# Patient Record
Sex: Male | Born: 1950 | Race: Black or African American | Hispanic: No | Marital: Married | Smoking: Never smoker
Health system: Southern US, Community
[De-identification: ages and names within clinical notes are randomized; demographics above are authoritative.]

## PROBLEM LIST (undated history)

## (undated) DIAGNOSIS — F411 Generalized anxiety disorder: Secondary | ICD-10-CM

## (undated) DIAGNOSIS — I10 Essential (primary) hypertension: Secondary | ICD-10-CM

## (undated) DIAGNOSIS — C801 Malignant (primary) neoplasm, unspecified: Secondary | ICD-10-CM

## (undated) DIAGNOSIS — E291 Testicular hypofunction: Secondary | ICD-10-CM

## (undated) DIAGNOSIS — N400 Enlarged prostate without lower urinary tract symptoms: Secondary | ICD-10-CM

## (undated) DIAGNOSIS — N529 Male erectile dysfunction, unspecified: Secondary | ICD-10-CM

## (undated) DIAGNOSIS — E785 Hyperlipidemia, unspecified: Secondary | ICD-10-CM

## (undated) HISTORY — DX: Benign prostatic hyperplasia without lower urinary tract symptoms: N40.0

## (undated) HISTORY — PX: WISDOM TOOTH EXTRACTION: SHX21

## (undated) HISTORY — DX: Male erectile dysfunction, unspecified: N52.9

## (undated) HISTORY — DX: Hyperlipidemia, unspecified: E78.5

## (undated) HISTORY — DX: Essential (primary) hypertension: I10

## (undated) HISTORY — DX: Generalized anxiety disorder: F41.1

## (undated) HISTORY — PX: COLONOSCOPY: SHX174

## (undated) HISTORY — DX: Testicular hypofunction: E29.1

---

## 1994-11-05 ENCOUNTER — Encounter: Payer: Self-pay | Admitting: Internal Medicine

## 2007-09-08 ENCOUNTER — Encounter: Admission: RE | Admit: 2007-09-08 | Discharge: 2007-09-08 | Payer: Self-pay | Admitting: Family Medicine

## 2007-09-26 ENCOUNTER — Encounter: Payer: Self-pay | Admitting: Internal Medicine

## 2008-05-12 ENCOUNTER — Ambulatory Visit: Payer: Self-pay | Admitting: Internal Medicine

## 2008-05-12 DIAGNOSIS — E291 Testicular hypofunction: Secondary | ICD-10-CM | POA: Insufficient documentation

## 2008-05-12 DIAGNOSIS — E785 Hyperlipidemia, unspecified: Secondary | ICD-10-CM

## 2008-05-12 DIAGNOSIS — F411 Generalized anxiety disorder: Secondary | ICD-10-CM

## 2008-05-12 DIAGNOSIS — N400 Enlarged prostate without lower urinary tract symptoms: Secondary | ICD-10-CM | POA: Insufficient documentation

## 2008-05-12 HISTORY — DX: Testicular hypofunction: E29.1

## 2008-05-12 HISTORY — DX: Generalized anxiety disorder: F41.1

## 2008-05-12 HISTORY — DX: Hyperlipidemia, unspecified: E78.5

## 2008-05-12 HISTORY — DX: Benign prostatic hyperplasia without lower urinary tract symptoms: N40.0

## 2008-09-07 ENCOUNTER — Ambulatory Visit: Payer: Self-pay | Admitting: Internal Medicine

## 2008-09-09 ENCOUNTER — Encounter: Payer: Self-pay | Admitting: Internal Medicine

## 2008-09-09 LAB — CONVERTED CEMR LAB
Albumin: 3.9 g/dL (ref 3.5–5.2)
Alkaline Phosphatase: 63 units/L (ref 39–117)
Basophils Absolute: 0.1 10*3/uL (ref 0.0–0.1)
Basophils Relative: 3.9 % — ABNORMAL HIGH (ref 0.0–3.0)
Bilirubin Urine: NEGATIVE
CO2: 32 meq/L (ref 19–32)
Chloride: 104 meq/L (ref 96–112)
Cholesterol: 188 mg/dL (ref 0–200)
Eosinophils Absolute: 0.3 10*3/uL (ref 0.0–0.7)
Glucose, Bld: 88 mg/dL (ref 70–99)
HCT: 47.7 % (ref 39.0–52.0)
HDL: 59.8 mg/dL (ref >39.00)
Hemoglobin: 15.8 g/dL (ref 13.0–17.0)
Ketones, ur: NEGATIVE mg/dL
Leukocytes, UA: NEGATIVE
Lymphs Abs: 1.6 10*3/uL (ref 0.7–4.0)
MCHC: 33.1 g/dL (ref 30.0–36.0)
MCV: 81 fL (ref 78.0–100.0)
Neutro Abs: 1.4 10*3/uL (ref 1.4–7.7)
Nitrite: NEGATIVE
Potassium: 4.3 meq/L (ref 3.5–5.1)
RBC: 5.89 M/uL — ABNORMAL HIGH (ref 4.22–5.81)
RDW: 14.4 % (ref 11.5–14.6)
Sodium: 140 meq/L (ref 135–145)
Specific Gravity, Urine: 1.01 (ref 1.000–1.030)
TSH: 0.53 microintl units/mL (ref 0.35–5.50)
Testosterone: >1630 ng/dL — ABNORMAL HIGH (ref 350.00–890.00)
Total Protein, Urine: NEGATIVE mg/dL
Total Protein: 7 g/dL (ref 6.0–8.3)
VLDL: 13.4 mg/dL (ref 0.0–40.0)
pH: 7.5 (ref 5.0–8.0)

## 2008-12-10 ENCOUNTER — Ambulatory Visit: Payer: Self-pay | Admitting: Internal Medicine

## 2008-12-10 LAB — CONVERTED CEMR LAB
ALT: 31 units/L (ref 0–53)
AST: 24 units/L (ref 0–37)
Albumin: 4.4 g/dL (ref 3.5–5.2)
Alkaline Phosphatase: 64 units/L (ref 39–117)
Cholesterol: 184 mg/dL (ref 0–200)
HDL: 66.9 mg/dL (ref >39.00)
Testosterone: 321.17 ng/dL — ABNORMAL LOW (ref 350.00–890.00)
Total Protein: 7.8 g/dL (ref 6.0–8.3)
Triglycerides: 42 mg/dL (ref 0.0–149.0)

## 2009-02-10 ENCOUNTER — Telehealth: Payer: Self-pay | Admitting: Internal Medicine

## 2009-02-23 ENCOUNTER — Encounter: Payer: Self-pay | Admitting: Internal Medicine

## 2009-06-10 IMAGING — CR DG LUMBAR SPINE COMPLETE 4+V
5 series · 5 of 5 positions shown · non-contrast
Comparison: None

CLINICAL DATA: Low right-sided back pain radiating to the right
inguinal area.

LUMBAR SPINE - COMPLETE 4+ VIEW

[view not recorded (1 of 5)]
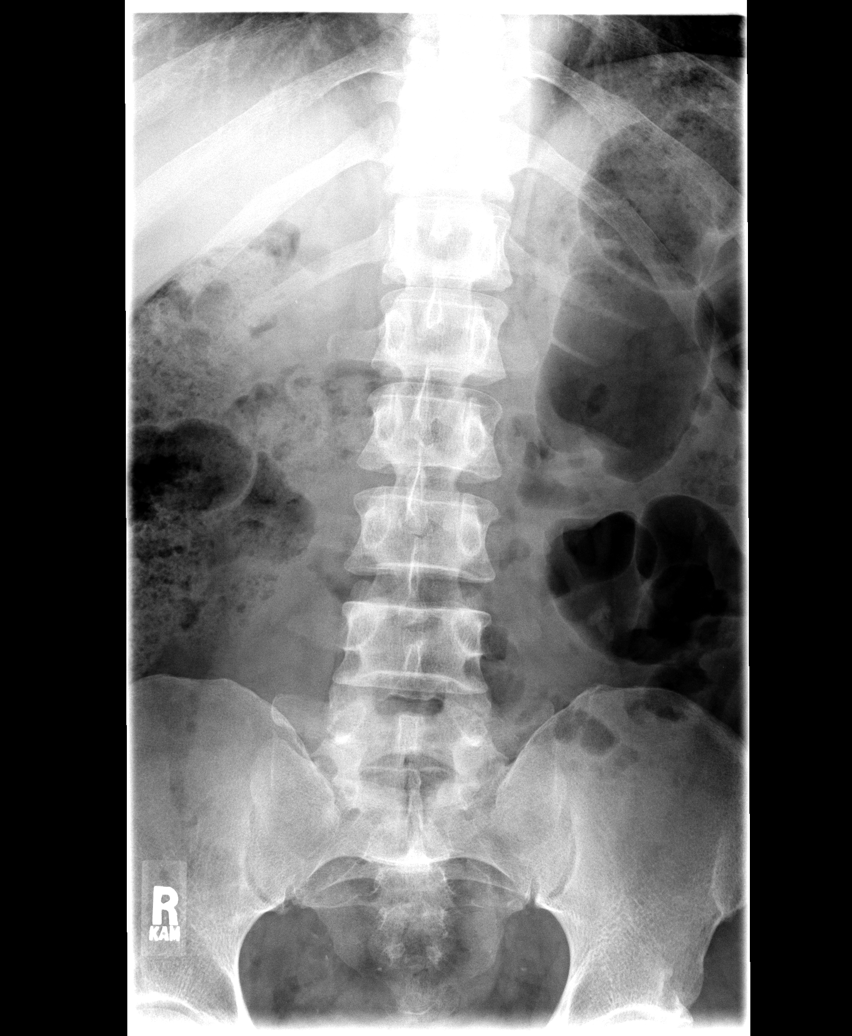

[view not recorded (2 of 5)]
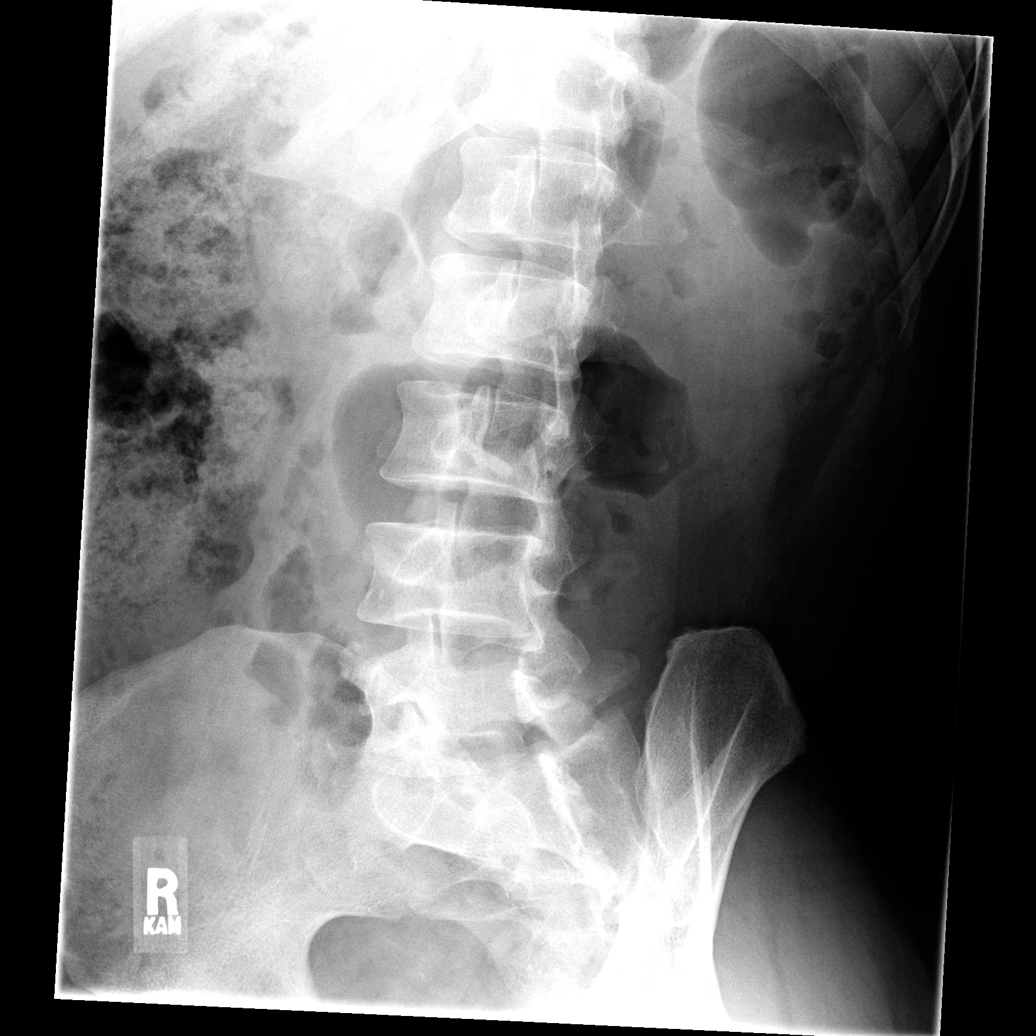

[view not recorded (3 of 5)]
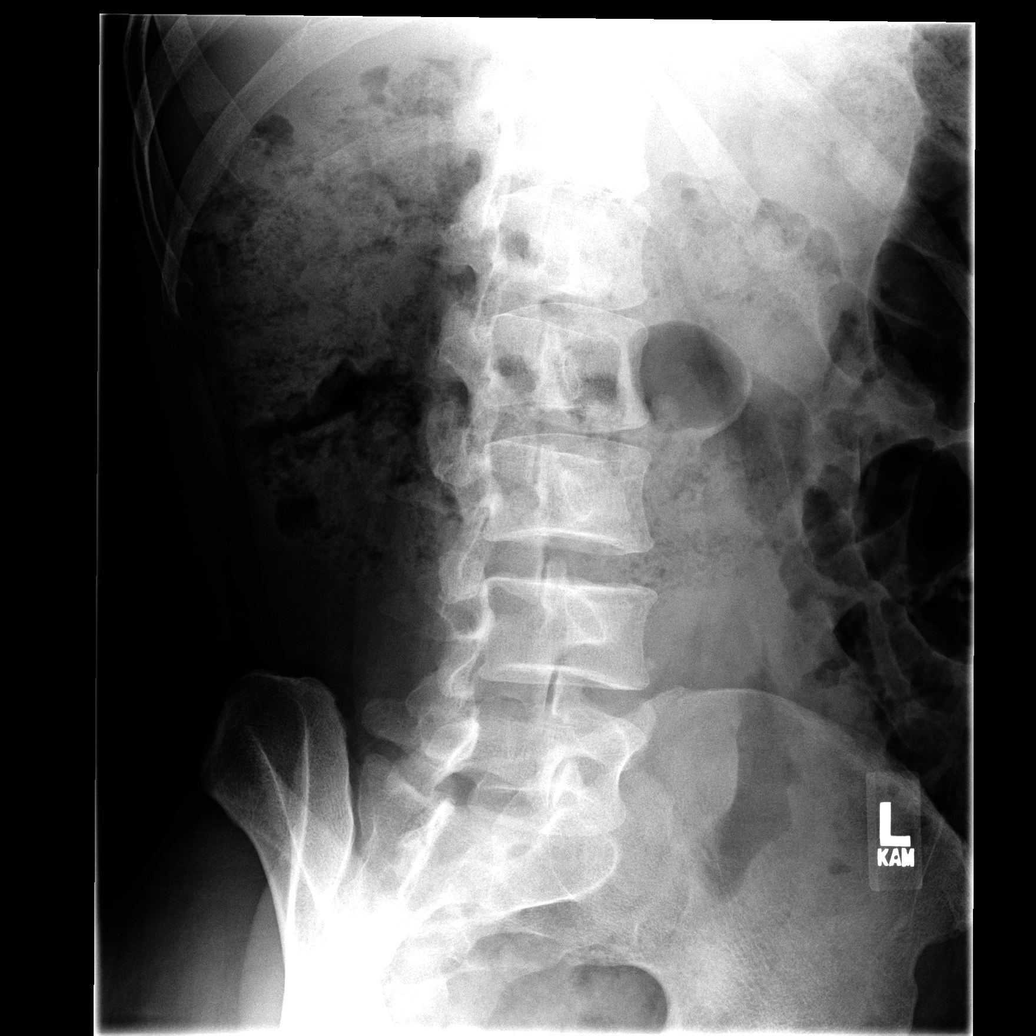

[view not recorded (4 of 5)]
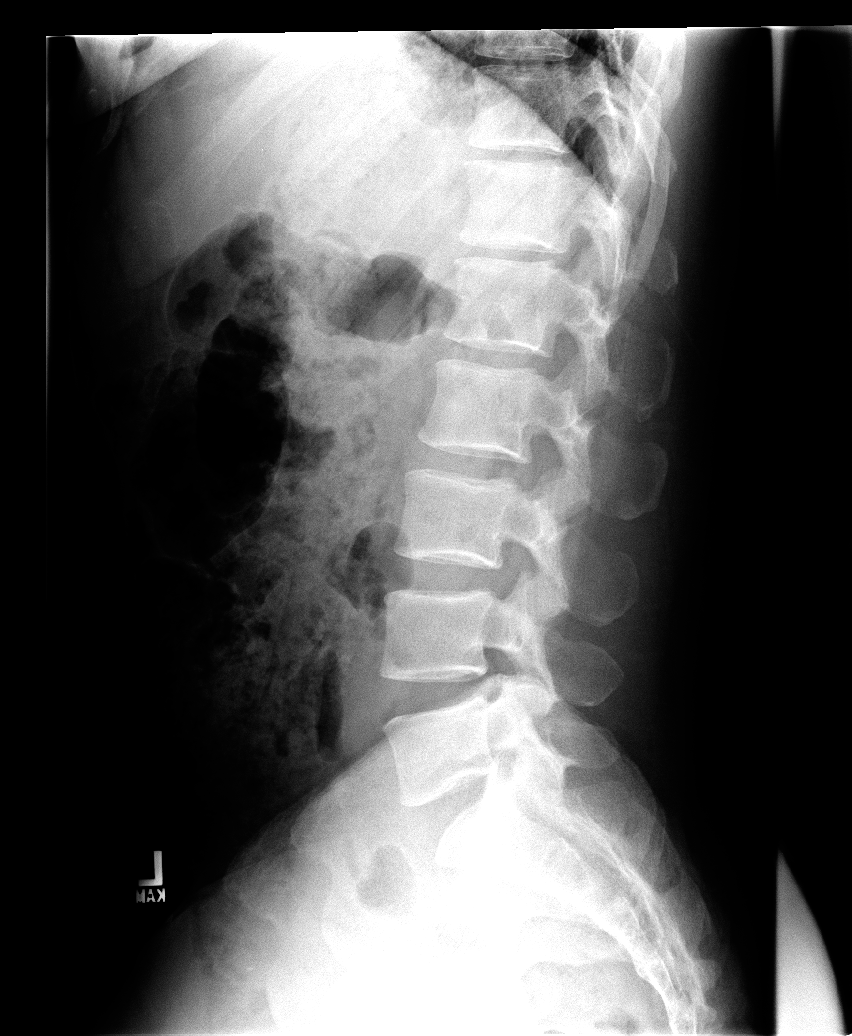

[view not recorded (5 of 5)]
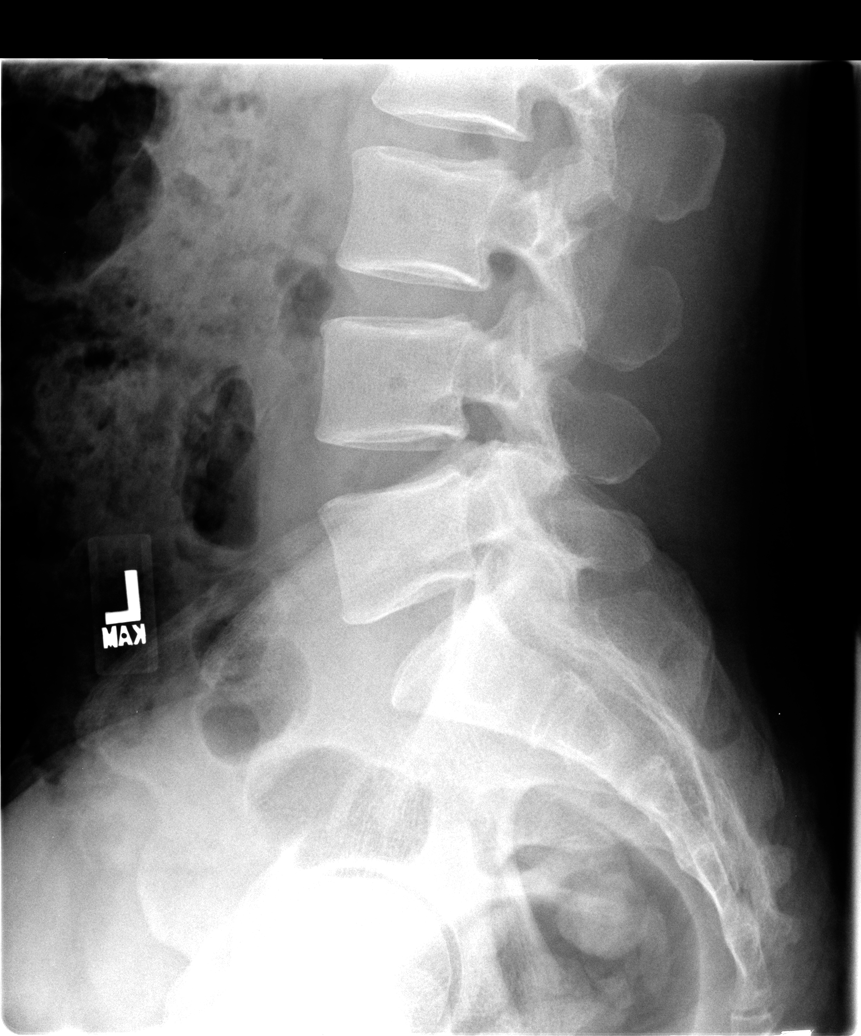

[5 of 5 positions shown; findings below may reference images not displayed]

FINDINGS: There are five lumbar type vertebral bodies in anatomic
alignment.  The disc spaces are preserved.  There is no evidence of
acute fracture, pars defect or significant facet disease.  A
moderate amount of stool is noted throughout the colon.
IMPRESSION: Normal lumbar spine radiographs.  Moderate stool throughout the
colon suggests constipation.

## 2010-01-05 ENCOUNTER — Telehealth: Payer: Self-pay | Admitting: Internal Medicine

## 2010-03-20 ENCOUNTER — Encounter: Payer: Self-pay | Admitting: Internal Medicine

## 2010-03-21 NOTE — Progress Notes (Signed)
Summary: Rx refill req  Phone Note Call from Patient Call back at Home Phone [phone]   Caller: Patient Call For: Jared Levins MD Summary of Call: Pt sched appt for CPX. Next available time was Apr 04, 2010, pt made appt for then. Pt needs refills of Lipitor to get him through till his pending appt. [facility] on [address].  Initial call taken by: Verdell Face,  January 05, 2010 3:51 PM    Prescriptions: LIPITOR 40 MG TABS (ATORVASTATIN CALCIUM) 1 by mouth once daily  #90 x 0   Entered by:   Margaret Pyle, CMA   Authorized by:   Jared Levins MD   Signed by:   Margaret Pyle, CMA on 01/05/2010   Method used:   Electronically to        [facility] [address] [phone]* (retail)       [address] [place], [place]  [address]       Ph: [phone]       Fax: [phone]   RxID:   [id number]

## 2010-03-21 NOTE — Letter (Signed)
[facility]   Imported By: Lester Shageluk 02/28/2009 08:19:34  _____________________________________________________________________  External Attachment:    Type:   Image     Comment:   External Document

## 2010-03-30 ENCOUNTER — Other Ambulatory Visit: Payer: Self-pay | Admitting: Internal Medicine

## 2010-03-30 ENCOUNTER — Other Ambulatory Visit: Payer: PRIVATE HEALTH INSURANCE

## 2010-03-30 ENCOUNTER — Encounter (INDEPENDENT_AMBULATORY_CARE_PROVIDER_SITE_OTHER): Payer: Self-pay | Admitting: *Deleted

## 2010-03-30 DIAGNOSIS — Z0000 Encounter for general adult medical examination without abnormal findings: Secondary | ICD-10-CM

## 2010-03-30 DIAGNOSIS — Z0389 Encounter for observation for other suspected diseases and conditions ruled out: Secondary | ICD-10-CM

## 2010-03-30 LAB — CBC WITH DIFFERENTIAL/PLATELET
Basophils Relative: 0.3 % (ref 0.0–3.0)
Eosinophils Absolute: 0.2 10*3/uL (ref 0.0–0.7)
HCT: 48.1 % (ref 39.0–52.0)
Hemoglobin: 15.9 g/dL (ref 13.0–17.0)
Lymphocytes Relative: 34.6 % (ref 12.0–46.0)
Lymphs Abs: 1.3 10*3/uL (ref 0.7–4.0)
MCHC: 33.1 g/dL (ref 30.0–36.0)
MCV: 80.3 fl (ref 78.0–100.0)
Neutro Abs: 1.8 10*3/uL (ref 1.4–7.7)
RBC: 5.99 Mil/uL — ABNORMAL HIGH (ref 4.22–5.81)

## 2010-03-30 LAB — URINALYSIS
Hgb urine dipstick: NEGATIVE
Ketones, ur: NEGATIVE
Urine Glucose: NEGATIVE
Urobilinogen, UA: 0.2 (ref 0.0–1.0)

## 2010-03-30 LAB — BASIC METABOLIC PANEL
CO2: 29 mEq/L (ref 19–32)
Calcium: 10.2 mg/dL (ref 8.4–10.5)
Chloride: 99 mEq/L (ref 96–112)
Glucose, Bld: 90 mg/dL (ref 70–99)
Potassium: 4.6 mEq/L (ref 3.5–5.1)
Sodium: 137 mEq/L (ref 135–145)

## 2010-03-30 LAB — HEPATIC FUNCTION PANEL
Albumin: 4.3 g/dL (ref 3.5–5.2)
Alkaline Phosphatase: 61 U/L (ref 39–117)
Total Protein: 7.1 g/dL (ref 6.0–8.3)

## 2010-03-30 LAB — LIPID PANEL: Total CHOL/HDL Ratio: 3

## 2010-03-30 LAB — TSH: TSH: 0.73 u[IU]/mL (ref 0.35–5.50)

## 2010-03-30 LAB — PSA: PSA: 0.32 ng/mL (ref 0.10–4.00)

## 2010-04-04 ENCOUNTER — Encounter (INDEPENDENT_AMBULATORY_CARE_PROVIDER_SITE_OTHER): Payer: Managed Care, Other (non HMO) | Admitting: Internal Medicine

## 2010-04-04 ENCOUNTER — Encounter: Payer: Self-pay | Admitting: Internal Medicine

## 2010-04-04 DIAGNOSIS — I10 Essential (primary) hypertension: Secondary | ICD-10-CM | POA: Insufficient documentation

## 2010-04-04 DIAGNOSIS — N529 Male erectile dysfunction, unspecified: Secondary | ICD-10-CM | POA: Insufficient documentation

## 2010-04-04 DIAGNOSIS — Z0000 Encounter for general adult medical examination without abnormal findings: Secondary | ICD-10-CM

## 2010-04-04 HISTORY — DX: Essential (primary) hypertension: I10

## 2010-04-04 HISTORY — DX: Male erectile dysfunction, unspecified: N52.9

## 2010-04-06 NOTE — Letter (Signed)
Summary: [facility]   Imported By: Sherian Rein 03/28/2010 07:13:23  _____________________________________________________________________  External Attachment:    Type:   Image     Comment:   External Document

## 2010-04-12 NOTE — Assessment & Plan Note (Signed)
Summary: CPX/[employer]/#/CD   Vital Signs:  Patient profile:   60 year old male Height:      70 inches Weight:      167.13 pounds BMI:     24.07 O2 Sat:      98 % on Room air Temp:     98.3 degrees F oral Pulse rate:   74 / minute BP sitting:   140 / 94  (left arm) Cuff size:   regular  Vitals Entered By: Zella Ball Ewing CMA (AAMA) (April 04, 2010 1:23 PM)  O2 Flow:  Room air  CC: CPX/RE   CC:  CPX/RE.  History of Present Illness: here for wellness and f/u;  Pt denies CP, worsening sob, doe, wheezing, orthopnea, pnd, worsening LE edema, palps, dizziness or syncope  Pt denies new neuro symptoms such as headache, facial or extremity weakness  Pt denies polydipsia, polyuria, overall good compliance with meds, trying to follow low chol, DM diet, wt stable, little excercise however .  Denies worsening depressive symptoms, suicidal ideation, or panic, though has ongoing anxiety.  No fever, wt loss, night sweats, loss of appetite or other constitutional symptoms  Overall good compliance with meds, and good tolerability.  No fever, wt loss, night sweats, loss of appetite or other constitutional symptoms  Pt states good ability with ADL's, low fall risk, home safety reviewed and adequate, no significant change in hearing or vision, trying to follow lower chol diet, and occasionally active only with regular excercise.  Does have worsening ED symtpoms over the past 6 mo, despite tx for hypogonad per urology.  BP at home have been often similar today , ranging in the 130 to 140's  Preventive Screening-Counseling & Management      Drug Use:  no.    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 1 By Mouth Once Daily 2)  Amitriptyline Hcl 25 Mg Tabs (Amitriptyline Hcl) .Marland Kitchen.. 1 By Mouth Once Daily 3)  Androgel 50 Mg/5gm Gel (Testosterone) .Marland Kitchen.. 1 Pk 4 Days Per Week As Directed 4)  Adult Aspirin Ec Low Strength 81 Mg Tbec (Aspirin) .Marland Kitchen.. 1 By Mouth Once Daily  Current Medications (verified): 1)  Lipitor 40 Mg Tabs (Atorvastatin Calcium) .Marland Kitchen.. 1 By Mouth Once Daily 2)  Amitriptyline Hcl 50 Mg Tabs (Amitriptyline Hcl) .Marland Kitchen.. 1 By Mouth At Bedtime As Needed 3)  Androgel 50 Mg/5gm Gel (Testosterone) .Marland Kitchen.. 1 Pk 4 Days Per Week As Directed 4)  Adult Aspirin Ec Low Strength 81 Mg Tbec (Aspirin) .Marland Kitchen.. 1 By Mouth Once Daily 5)  Losartan Potassium 50 Mg Tabs (Losartan Potassium) .Marland Kitchen.. 1po Once Daily 6)  Levitra 20 Mg Tabs (Vardenafil Hcl) .Marland Kitchen.. 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         all otherwise negative per pt -    Physical Exam  General:  alert and well-developed.   Head:  normocephalic and atraumatic.   Eyes:  vision grossly intact, pupils equal, and pupils round.   Ears:  R ear normal and L ear normal.   Nose:  no external deformity and no nasal discharge.   Mouth:  no gingival abnormalities and pharynx pink and moist.   Neck:  supple and no masses.   Lungs:  normal respiratory effort and normal breath sounds.   Heart:  normal rate and regular rhythm.   Abdomen:  soft, non-tender, and normal bowel sounds.   Msk:  normal ROM, no joint tenderness, and no joint swelling.   Extremities:  no edema, no ulcers  Neurologic:  cranial nerves II-XII intact and strength normal in all extremities.   Skin:  color normal and no rashes.   Psych:  not depressed appearing and slightly anxious.     Impression & Recommendations:  Problem # 1:  Preventive Health Care (ICD-V70.0) Overall doing well, age appropriate education and counseling updated, referral for preventive services and immunizations addressed, dietary counseling and smoking status adressed , most recent labs reviewed, ecg reviewed I have personally reviewed and have noted 1.The patient's medical and social history 2.Their use of alcohol, tobacco or illicit drugs 3.Their current medications and supplements 4. Functional ability including ADL's, fall risk, home safety risk, hearing & visual impairment  5.Diet and physical activities 6.Evidence for depression or mood disorders The patients weight, height, BMI  have been recorded in the chart I have made referrals, counseling and provided education to the patient based review of the above  Orders: EKG w/ Interpretation (93000) EKG w/ Interpretation (93000)  Problem # 2:  HYPERTENSION  (ICD-401.9)  new onset - for losartan 50 mg once daily ; f/u with BP at home and at home; to call in 3-4 wks if not improved  His updated medication list for this problem includes:    Losartan Potassium 50 Mg Tabs (Losartan potassium) .Marland Kitchen... 1po once daily  BP today: 140/94 Prior BP: 152/96 (09/07/2008)  Labs Reviewed: K+: 4.6 (03/30/2010) Creat: : 1.1 (03/30/2010)   Chol: 164 (03/30/2010)   HDL: 62.30 (03/30/2010)   LDL: 92 (03/30/2010)   TG: 47.0 (03/30/2010)  Problem # 3:  ERECTILE DYSFUNCTION, ORGANIC (ICD-607.84)  for levitra as needed   His updated medication list for this problem includes:    Levitra 20 Mg Tabs (Vardenafil hcl) .Marland Kitchen... 1 by mouth every other day as needed  Discussed proper use of medications, as well as side effects.   Problem # 4:  HYPERLIPIDEMIA (ICD-272.4)  His updated medication list for this problem includes:    Lipitor 40 Mg Tabs (Atorvastatin calcium) .Marland Kitchen... 1 by mouth once daily  Labs Reviewed: SGOT: 24 (03/30/2010)   SGPT: 28 (03/30/2010)   HDL:62.30 (03/30/2010), 66.90 (12/10/2008)  LDL:92 (03/30/2010), 109 (  12/10/2008)  Chol:164 (03/30/2010), 184 (12/10/2008)  Trig:47.0 (03/30/2010), 42.0 (12/10/2008) stable overall by hx and exam, ok to continue meds/tx as is   Complete Medication List: 1)  Lipitor 40 Mg Tabs (Atorvastatin calcium) .Marland Kitchen.. 1 by mouth once daily 2)  Amitriptyline Hcl 50 Mg Tabs (Amitriptyline hcl) .Marland Kitchen.. 1 by mouth at bedtime as needed 3)  Androgel 50 Mg/5gm Gel (Testosterone) .Marland Kitchen.. 1 pk 4 days per week as directed 4)  Adult Aspirin Ec Low Strength 81 Mg Tbec (Aspirin) .Marland Kitchen.. 1 by mouth once daily 5)  Losartan Potassium 50 Mg Tabs (Losartan potassium) .Marland Kitchen.. 1po once daily 6)  Levitra 20 Mg Tabs (Vardenafil hcl) .Marland Kitchen.. 1 by mouth every other day as needed  Patient Instructions: 1)  Please take all new medications as prescribed  - the losartan for blood pressure, and the levitra as needed  2)  Continue all previous medications as before  this visit  3)  Please keep up your specialists appts as you do 4)  Please schedule a follow-up appointment in 6 months or sooner if needed 5)  Check your Blood Pressure regularly. If it is above 140/90 on a regular basis: you should make an appointment or call , after taking the new Blood Pressure meeting for ast 3-4 wks Prescriptions: LEVITRA 20 MG TABS (VARDENAFIL HCL) 1 by mouth every other day as needed  #5 x 11   Entered and Authorized by:   Corwin Levins MD   Signed by:   Corwin Levins MD on 04/04/2010   Method used:   Print then Give to Patient   RxID:   [id number] LOSARTAN POTASSIUM 50 MG TABS (LOSARTAN POTASSIUM) 1po once daily  #90 x 3   Entered and Authorized by:   Corwin Levins MD   Signed by:   Corwin Levins MD on 04/04/2010   Method used:   Print then Give to Patient   RxID:   [id number] LIPITOR 40 MG TABS (ATORVASTATIN CALCIUM) 1 by mouth once daily  #90 x 3   Entered and Authorized by:   Corwin Levins MD   Signed by:   Corwin Levins MD on 04/04/2010   Method used:   Print then Give to Patient   RxID:   [id number] WNUUVOZD POTASSIUM 50 MG TABS (LOSARTAN POTASSIUM) 1po once daily  #30 x 11   Entered and Authorized by:   Corwin Levins MD   Signed by:   Corwin Levins MD on 04/04/2010   Method used:   Print then Give to Patient   RxID:   [id number] LIPITOR 40 MG TABS (ATORVASTATIN CALCIUM) 1 by mouth once daily  #30 x 11   Entered and Authorized by:   Corwin Levins MD   Signed by:   Corwin Levins MD on 04/04/2010   Method used:   Print then Give to Patient   RxID:   [id number]    Orders Added: 1)  EKG w/ Interpretation [93000] 2)  EKG w/ Interpretation [93000] 3)  Est. Patient 40-64 years [06301]

## 2010-06-16 DIAGNOSIS — N400 Enlarged prostate without lower urinary tract symptoms: Secondary | ICD-10-CM

## 2010-06-18 ENCOUNTER — Encounter: Payer: Self-pay | Admitting: Internal Medicine

## 2010-06-18 DIAGNOSIS — Z0000 Encounter for general adult medical examination without abnormal findings: Secondary | ICD-10-CM | POA: Insufficient documentation

## 2010-06-20 ENCOUNTER — Ambulatory Visit (INDEPENDENT_AMBULATORY_CARE_PROVIDER_SITE_OTHER): Payer: Managed Care, Other (non HMO) | Admitting: Internal Medicine

## 2010-06-20 ENCOUNTER — Encounter: Payer: Self-pay | Admitting: Internal Medicine

## 2010-06-20 VITALS — BP 132/82 | HR 85 | Temp 98.4°F | Ht 70.0 in | Wt 171.1 lb

## 2010-06-20 DIAGNOSIS — F411 Generalized anxiety disorder: Secondary | ICD-10-CM

## 2010-06-20 DIAGNOSIS — I10 Essential (primary) hypertension: Secondary | ICD-10-CM

## 2010-06-20 DIAGNOSIS — Z0000 Encounter for general adult medical examination without abnormal findings: Secondary | ICD-10-CM

## 2010-06-20 DIAGNOSIS — N529 Male erectile dysfunction, unspecified: Secondary | ICD-10-CM

## 2010-06-20 MED ORDER — SILDENAFIL CITRATE 100 MG PO TABS: 100.0000 mg | ORAL_TABLET | ORAL | Status: DC | PRN

## 2010-06-20 MED ORDER — TADALAFIL 20 MG PO TABS: 20.0000 mg | ORAL_TABLET | Freq: Every day | ORAL | Status: AC | PRN

## 2010-06-20 NOTE — Assessment & Plan Note (Signed)
Improved, Continue all other medications as before,  to f/u any worsening symptoms or concerns  BP Readings from Last 3 Encounters:  06/20/10 132/82  04/04/10 140/94  09/07/08 152/96

## 2010-06-20 NOTE — Progress Notes (Signed)
  Subjective:    Patient ID: Jared Cantrell, male    DOB: Dec 03, 1950, 60 y.o.   MRN: [id number]  HPI  Here to f/u;  Here to f/u; overall doing ok,  Pt denies chest pain, increased sob or doe, wheezing, orthopnea, PND, increased LE swelling, palpitations, dizziness or syncope.  Pt denies new neurological symptoms such as new headache, or facial or extremity weakness or numbness   Pt denies polydipsia, polyuria .  Pt states overall good compliance with meds, trying to follow lower cholesterol, diet, wt overall stable but little exercise however.  Levitra unfortunatetly simply did not seem tob e effective for him and his ED symtpoms, though has intact desire and libido, and is complaint with the testosterone replacement.  Denies worsening depressive symptoms, suicidal ideation, or panic, though has ongoing anxiety, not increased recently.   Past Medical History  Diagnosis Date  . HYPOGONADISM, MALE 05/12/2008  . HYPERLIPIDEMIA 05/12/2008  . ANXIETY 05/12/2008  . BENIGN PROSTATIC HYPERTROPHY 05/12/2008  . HYPERTENSION 04/04/2010  . 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  reports that he has never smoked. He does not have any smokeless tobacco history on file. He reports that he drinks alcohol. He reports that he does not use illicit drugs. family history includes Arthritis in his mother; Cancer in his brother; Diabetes in his mother; Hypertension in his brother, mother, and sister; and Stroke in his sister. No Known Allergies Current Outpatient Prescriptions on File Prior to Visit  Medication Sig Dispense Refill  . amitriptyline (ELAVIL) 50 MG tablet Take 50 mg by mouth at bedtime.        Marland Kitchen aspirin 81 MG EC tablet Take 81 mg by mouth daily.        Marland Kitchen atorvastatin (LIPITOR) 40 MG tablet Take 40 mg by mouth daily.        Marland Kitchen losartan (COZAAR) 50 MG tablet Take 50 mg by mouth daily.        Marland Kitchen testosterone (ANDROGEL) 50 MG/5GM GEL 1 pack 4 days per week as directed        . vardenafil (LEVITRA) 20 MG tablet 1 by mouth every other day as needed        Review of Systems All otherwise neg per pt     Objective:   Physical Exam BP 132/82  Pulse 85  Temp(Src) 98.4 F (36.9 C) (Oral)  Ht 5\' 10"  (1.778 m)  Wt 171 lb 2 oz (77.622 kg)  BMI 24.55 kg/m2  SpO2 97% Physical Exam  VS noted Constitutional: Pt appears well-developed and well-nourished.  HENT: Head: Normocephalic.  Right Ear: External ear normal.  Left Ear: External ear normal.  Eyes: Conjunctivae and EOM are normal. Pupils are equal, round, and reactive to light.  Neck: Normal range of motion. Neck supple.  Cardiovascular: Normal rate and regular rhythm.   Pulmonary/Chest: Effort normal and breath sounds normal.  Abd:  Soft, NT, non-distended, + BS Neurological: Pt is alert. No cranial nerve deficit.  Skin: Skin is warm. No erythema.  Psychiatric: Pt behavior is normal. Thought content normal.         Assessment & Plan:

## 2010-06-20 NOTE — Patient Instructions (Signed)
The cialis prescription is at the pharmacy for the free coupon to use You are given the hardcopy of the viagra as you need Continue all other medications as before Please return in 14mo  for your yearly visit, or sooner if needed, with Lab testing done 3-5 days before

## 2010-06-20 NOTE — Assessment & Plan Note (Signed)
No response with the levitra, today gave coupon and rx for 3 free cialis, as well as rx for viagra to try; pt to cont testosterone replacement per urology,  to f/u any worsening symptoms or concerns

## 2010-06-20 NOTE — Assessment & Plan Note (Signed)
stable overall by hx and exam, most recent lab reviewed with pt, and pt to continue medical treatment as before  Lab Results  Component Value Date   WBC 3.7* 03/30/2010   HGB 15.9 03/30/2010   HCT 48.1 03/30/2010   PLT 192.0 03/30/2010   CHOL 164 03/30/2010   TRIG 47.0 03/30/2010   HDL 62.30 03/30/2010   ALT 28 03/30/2010   AST 24 03/30/2010   NA 137 03/30/2010   K 4.6 03/30/2010   CL 99 03/30/2010   CREATININE 1.1 03/30/2010   BUN 11 03/30/2010   CO2 29 03/30/2010   TSH 0.73 03/30/2010   PSA 0.32 03/30/2010

## 2010-10-03 ENCOUNTER — Ambulatory Visit: Payer: Managed Care, Other (non HMO) | Admitting: Internal Medicine

## 2011-03-23 ENCOUNTER — Ambulatory Visit (INDEPENDENT_AMBULATORY_CARE_PROVIDER_SITE_OTHER): Payer: Managed Care, Other (non HMO) | Admitting: Internal Medicine

## 2011-03-23 ENCOUNTER — Other Ambulatory Visit (INDEPENDENT_AMBULATORY_CARE_PROVIDER_SITE_OTHER): Payer: Managed Care, Other (non HMO)

## 2011-03-23 ENCOUNTER — Encounter: Payer: Self-pay | Admitting: Internal Medicine

## 2011-03-23 VITALS — BP 122/82 | HR 71 | Temp 97.0°F | Ht 70.0 in | Wt 171.0 lb

## 2011-03-23 DIAGNOSIS — Z0000 Encounter for general adult medical examination without abnormal findings: Secondary | ICD-10-CM

## 2011-03-23 DIAGNOSIS — R42 Dizziness and giddiness: Secondary | ICD-10-CM

## 2011-03-23 DIAGNOSIS — E291 Testicular hypofunction: Secondary | ICD-10-CM

## 2011-03-23 DIAGNOSIS — N529 Male erectile dysfunction, unspecified: Secondary | ICD-10-CM

## 2011-03-23 LAB — CBC WITH DIFFERENTIAL/PLATELET
Basophils Absolute: 0 10*3/uL (ref 0.0–0.1)
HCT: 50.4 % (ref 39.0–52.0)
Lymphs Abs: 1.4 10*3/uL (ref 0.7–4.0)
MCHC: 32.1 g/dL (ref 30.0–36.0)
MCV: 81.8 fl (ref 78.0–100.0)
Monocytes Absolute: 0.3 10*3/uL (ref 0.1–1.0)
Platelets: 203 10*3/uL (ref 150.0–400.0)
RDW: 15.5 % — ABNORMAL HIGH (ref 11.5–14.6)

## 2011-03-23 LAB — URINALYSIS, ROUTINE W REFLEX MICROSCOPIC
Bilirubin Urine: NEGATIVE
Ketones, ur: 15
Leukocytes, UA: NEGATIVE
Urine Glucose: NEGATIVE
Urobilinogen, UA: 0.2 (ref 0.0–1.0)

## 2011-03-23 LAB — HEPATIC FUNCTION PANEL
ALT: 32 U/L (ref 0–53)
Bilirubin, Direct: 0.1 mg/dL (ref 0.0–0.3)
Total Bilirubin: 0.9 mg/dL (ref 0.3–1.2)

## 2011-03-23 LAB — BASIC METABOLIC PANEL
BUN: 13 mg/dL (ref 6–23)
CO2: 31 mEq/L (ref 19–32)
Chloride: 101 mEq/L (ref 96–112)
Glucose, Bld: 90 mg/dL (ref 70–99)
Potassium: 4.7 mEq/L (ref 3.5–5.1)

## 2011-03-23 LAB — LIPID PANEL
LDL Cholesterol: 111 mg/dL — ABNORMAL HIGH (ref 0–99)
VLDL: 10.8 mg/dL (ref 0.0–40.0)

## 2011-03-23 LAB — TSH: TSH: 0.69 u[IU]/mL (ref 0.35–5.50)

## 2011-03-23 MED ORDER — LOSARTAN POTASSIUM 50 MG PO TABS: 50.0000 mg | ORAL_TABLET | Freq: Every day | ORAL | Status: DC

## 2011-03-23 MED ORDER — TESTOSTERONE 20.25 MG/1.25GM (1.62%) TD GEL: 1.0000 "application " | Freq: Every day | TRANSDERMAL | Status: DC

## 2011-03-23 MED ORDER — SILDENAFIL CITRATE 100 MG PO TABS: 100.0000 mg | ORAL_TABLET | ORAL | Status: DC | PRN

## 2011-03-23 MED ORDER — AMITRIPTYLINE HCL 50 MG PO TABS: 50.0000 mg | ORAL_TABLET | Freq: Every day | ORAL | Status: DC

## 2011-03-23 MED ORDER — ATORVASTATIN CALCIUM 40 MG PO TABS: 40.0000 mg | ORAL_TABLET | Freq: Every day | ORAL | Status: DC

## 2011-03-23 MED ORDER — TESTOSTERONE 20.25 MG/ACT (1.62%) TD GEL: 1.0000 "application " | Freq: Every day | TRANSDERMAL | Status: DC

## 2011-03-23 NOTE — Assessment & Plan Note (Signed)
With head position change only, but reproducible with full neck flexion, for carotid u/s

## 2011-03-23 NOTE — Patient Instructions (Signed)
Continue all other medications as before; you are given the hardcopy refills today Please go to LAB in the Basement for the blood and/or urine tests to be done today Please call the phone number [phone] (the PhoneTree System) for results of testing in 2-3 days;  When calling, simply dial the number, and when prompted enter the MRN number above (the Medical Record Number) and the # key, then the message should start. You will be contacted regarding the referral for: carotid ultrasound Please return in 1 year for your yearly visit, or sooner if needed, with Lab testing done 3-5 days before

## 2011-03-24 ENCOUNTER — Encounter: Payer: Self-pay | Admitting: Internal Medicine

## 2011-03-24 NOTE — Progress Notes (Signed)
Subjective:    Patient ID: Jared Cantrell, male    DOB: 07-Jun-1950, 61 y.o.   MRN: [id number]  HPI  Here for wellness and f/u;  Overall doing ok;  Pt denies CP, worsening SOB, DOE, wheezing, orthopnea, PND, worsening LE edema, palpitations, or syncope.  Pt denies neurological change such as new Headache, facial or extremity weakness.  Pt denies polydipsia, polyuria, or low sugar symptoms. Pt states overall good compliance with treatment and medications, good tolerability, and trying to follow lower cholesterol diet.  Pt denies worsening depressive symptoms, suicidal ideation or panic. No fever, wt loss, night sweats, loss of appetite, or other constitutional symptoms.  Pt states good ability with ADL's, low fall risk, home safety reviewed and adequate, no significant changes in hearing or vision, and occasionally active with exercise.  Has been out of testosterone pump, needs refill.  Also c/o unusual problem for 1-2 yrs worsening freq but reproducible symptoms of dizzy/lightheaded ((not vertigo) when head/chin is lowered to chest, no syncope. Past Medical History  Diagnosis Date  . HYPOGONADISM, MALE 05/12/2008  . HYPERLIPIDEMIA 05/12/2008  . ANXIETY 05/12/2008  . BENIGN PROSTATIC HYPERTROPHY 05/12/2008  . HYPERTENSION 04/04/2010  . 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  reports that he has never smoked. He does not have any smokeless tobacco history on file. He reports that he drinks alcohol. He reports that he does not use illicit drugs. family history includes Arthritis in his mother; Cancer in his brother; Diabetes in his mother; Hypertension in his brother, mother, and sister; and Stroke in his sister. No Known Allergies Current Outpatient Prescriptions on File Prior to Visit  Medication Sig Dispense Refill  . aspirin 81 MG EC tablet Take 81 mg by mouth daily.         Review of Systems Review of Systems  Constitutional: Negative for diaphoresis,  activity change, appetite change and unexpected weight change.  HENT: Negative for hearing loss, ear pain, facial swelling, mouth sores and neck stiffness.   Eyes: Negative for pain, redness and visual disturbance.  Respiratory: Negative for shortness of breath and wheezing.   Cardiovascular: Negative for chest pain and palpitations.  Gastrointestinal: Negative for diarrhea, blood in stool, abdominal distention and rectal pain.  Genitourinary: Negative for hematuria, flank pain and decreased urine volume.  Musculoskeletal: Negative for myalgias and joint swelling.  Skin: Negative for color change and wound.  Neurological: Negative for syncope and numbness.  Hematological: Negative for adenopathy.  Psychiatric/Behavioral: Negative for hallucinations, self-injury, decreased concentration and agitation.      Objective:   Physical Exam BP 122/82  Pulse 71  Temp(Src) 97 F (36.1 C) (Oral)  Ht 5\' 10"  (1.778 m)  Wt 171 lb (77.565 kg)  BMI 24.54 kg/m2  SpO2 95% Physical Exam  VS noted Constitutional: Pt is oriented to person, place, and time. Appears well-developed and well-nourished.  HENT:  Head: Normocephalic and atraumatic.  Right Ear: External ear normal.  Left Ear: External ear normal.  Nose: Nose normal.  Mouth/Throat: Oropharynx is clear and moist.  Eyes: Conjunctivae and EOM are normal. Pupils are equal, round, and reactive to light.  Neck: Normal range of motion. Neck supple. No JVD present. No tracheal deviation present.  Cardiovascular: Normal rate, regular rhythm, normal heart sounds and intact distal pulses.   Pulmonary/Chest: Effort normal and breath sounds normal.  Abdominal: Soft. Bowel sounds are normal. There is no tenderness.  Musculoskeletal: Normal range of motion. Exhibits no edema.  Lymphadenopathy:  Has no cervical adenopathy.  Neurological: Pt is alert and oriented to person, place, and time. Pt has normal reflexes. No cranial nerve deficit.  Skin: Skin is  warm and dry. No rash noted.  Psychiatric:  Has  normal mood and affect. Behavior is normal.     Assessment & Plan:

## 2011-03-24 NOTE — Assessment & Plan Note (Signed)
For med refill today 

## 2011-03-24 NOTE — Assessment & Plan Note (Signed)

## 2011-03-24 NOTE — Assessment & Plan Note (Signed)
For viagra refill as well

## 2011-03-26 ENCOUNTER — Encounter (INDEPENDENT_AMBULATORY_CARE_PROVIDER_SITE_OTHER): Payer: Managed Care, Other (non HMO) | Admitting: Cardiology

## 2011-03-26 ENCOUNTER — Other Ambulatory Visit: Payer: Self-pay | Admitting: Cardiology

## 2011-03-26 DIAGNOSIS — R42 Dizziness and giddiness: Secondary | ICD-10-CM

## 2011-03-26 LAB — TESTOSTERONE, FREE, TOTAL, SHBG
Testosterone-% Free: 1.7 % (ref 1.6–2.9)
Testosterone: 197.35 ng/dL — ABNORMAL LOW (ref 250–890)

## 2011-04-15 ENCOUNTER — Other Ambulatory Visit: Payer: Self-pay | Admitting: Internal Medicine

## 2011-04-18 ENCOUNTER — Other Ambulatory Visit: Payer: Self-pay

## 2011-04-18 MED ORDER — LOSARTAN POTASSIUM 50 MG PO TABS: 50.0000 mg | ORAL_TABLET | Freq: Every day | ORAL | Status: DC

## 2011-07-09 ENCOUNTER — Telehealth: Payer: Self-pay

## 2011-07-09 NOTE — Telephone Encounter (Signed)
Called [facility] for PA on Viagra and Testosterone Gel .  Both medications have been approved. Viagra from 06/18/11 through 07/08/2012 GN#[id number] and Testosterone gel approved 06/09/11 through 07/08/2012 Ref# [id number].  Called the patient informed that both medications have been approved.  [facility] will send approval letter on both medications within 30 minutes. Copied all information put in PA folder and sent orginals to scan into chart.

## 2012-03-25 ENCOUNTER — Other Ambulatory Visit (INDEPENDENT_AMBULATORY_CARE_PROVIDER_SITE_OTHER): Payer: Managed Care, Other (non HMO)

## 2012-03-25 ENCOUNTER — Ambulatory Visit (INDEPENDENT_AMBULATORY_CARE_PROVIDER_SITE_OTHER)
Admission: RE | Admit: 2012-03-25 | Discharge: 2012-03-25 | Disposition: A | Discharge status: Home / Self Care | Payer: BC Managed Care – PPO | Source: Ambulatory Visit | Attending: Internal Medicine | Admitting: Internal Medicine

## 2012-03-25 ENCOUNTER — Encounter: Payer: Self-pay | Admitting: Internal Medicine

## 2012-03-25 ENCOUNTER — Ambulatory Visit (INDEPENDENT_AMBULATORY_CARE_PROVIDER_SITE_OTHER): Payer: BC Managed Care – PPO | Admitting: Internal Medicine

## 2012-03-25 VITALS — BP 134/92 | HR 95 | Temp 98.0°F | Ht 70.0 in | Wt 173.5 lb

## 2012-03-25 DIAGNOSIS — I10 Essential (primary) hypertension: Secondary | ICD-10-CM

## 2012-03-25 DIAGNOSIS — E291 Testicular hypofunction: Secondary | ICD-10-CM

## 2012-03-25 DIAGNOSIS — Z0000 Encounter for general adult medical examination without abnormal findings: Secondary | ICD-10-CM

## 2012-03-25 DIAGNOSIS — E785 Hyperlipidemia, unspecified: Secondary | ICD-10-CM

## 2012-03-25 LAB — HEPATIC FUNCTION PANEL
AST: 30 U/L (ref 0–37)
Albumin: 4.2 g/dL (ref 3.5–5.2)
Alkaline Phosphatase: 67 U/L (ref 39–117)
Bilirubin, Direct: 0.2 mg/dL (ref 0.0–0.3)
Total Bilirubin: 0.9 mg/dL (ref 0.3–1.2)

## 2012-03-25 LAB — CBC WITH DIFFERENTIAL/PLATELET
Basophils Absolute: 0 10*3/uL (ref 0.0–0.1)
Eosinophils Absolute: 0.3 10*3/uL (ref 0.0–0.7)
Hemoglobin: 15.6 g/dL (ref 13.0–17.0)
Lymphocytes Relative: 32.1 % (ref 12.0–46.0)
MCHC: 32.4 g/dL (ref 30.0–36.0)
Monocytes Relative: 11.1 % (ref 3.0–12.0)
Neutro Abs: 2 10*3/uL (ref 1.4–7.7)
Neutrophils Relative %: 49.9 % (ref 43.0–77.0)
RDW: 15.7 % — ABNORMAL HIGH (ref 11.5–14.6)

## 2012-03-25 LAB — URINALYSIS, ROUTINE W REFLEX MICROSCOPIC
Nitrite: NEGATIVE
Specific Gravity, Urine: 1.01 (ref 1.000–1.030)
Total Protein, Urine: NEGATIVE
Urine Glucose: NEGATIVE
Urobilinogen, UA: 0.2 (ref 0.0–1.0)

## 2012-03-25 LAB — LIPID PANEL
LDL Cholesterol: 108 mg/dL — ABNORMAL HIGH (ref 0–99)
Total CHOL/HDL Ratio: 3

## 2012-03-25 LAB — BASIC METABOLIC PANEL
BUN: 11 mg/dL (ref 6–23)
CO2: 32 mEq/L (ref 19–32)
Calcium: 10.6 mg/dL — ABNORMAL HIGH (ref 8.4–10.5)
Chloride: 103 mEq/L (ref 96–112)
Creatinine, Ser: 1 mg/dL (ref 0.4–1.5)
Glucose, Bld: 98 mg/dL (ref 70–99)

## 2012-03-25 LAB — TSH: TSH: 0.88 u[IU]/mL (ref 0.35–5.50)

## 2012-03-25 MED ORDER — AMITRIPTYLINE HCL 50 MG PO TABS: 50.0000 mg | ORAL_TABLET | Freq: Every day | ORAL | Status: AC

## 2012-03-25 MED ORDER — ATORVASTATIN CALCIUM 40 MG PO TABS: 40.0000 mg | ORAL_TABLET | Freq: Every day | ORAL | Status: DC

## 2012-03-25 MED ORDER — LOSARTAN POTASSIUM 100 MG PO TABS: 100.0000 mg | ORAL_TABLET | Freq: Every day | ORAL | Status: DC

## 2012-03-25 NOTE — Patient Instructions (Addendum)
Ok to increase the losartan to 100 mg per day Please continue all other medications as before, and refills have been done hardcopy to you today Please have the pharmacy call with any other refills you may need. Please continue your efforts at being more active, low cholesterol diet, and weight control. You are otherwise up to date with prevention measures today. Please go to the LAB in the Basement (turn left off the elevator) for the tests to be done today Please go to the XRAY Department in the Basement (go straight as you get off the elevator) for the x-ray testing You will be contacted by phone if any changes need to be made immediately.  Otherwise, you will receive a letter about your results with an explanation, but please check with MyChart first. Thank you for enrolling in MyChart. Please follow the instructions below to securely access your online medical record. MyChart allows you to send messages to your doctor, view your test results, renew your prescriptions, schedule appointments, and more. To Log into My Chart online, please go by [employer] or [employer] to [email or web address], or download the MyChart App from the [employer] .  Your Username is: [email or web address]  (pass [email or web address]) Please send a practice Message on Mychart later today. Please return in 6 months, or sooner if needed

## 2012-03-25 NOTE — Assessment & Plan Note (Signed)

## 2012-03-25 NOTE — Assessment & Plan Note (Addendum)
Mild, with trend upward over last several yrs; for PTH/ion calcium, CXR, vit d

## 2012-03-25 NOTE — Assessment & Plan Note (Signed)
Mild uncontrolled, to incr the losartan to 100 mg

## 2012-03-26 LAB — TESTOSTERONE, FREE, TOTAL, SHBG: Testosterone-% Free: 2.2 % (ref 1.6–2.9)

## 2012-03-30 ENCOUNTER — Encounter: Payer: Self-pay | Admitting: Internal Medicine

## 2012-03-30 NOTE — Progress Notes (Signed)
Subjective:    Patient ID: Jared Cantrell, male    DOB: 10-19-1950, 62 y.o.   MRN: [id number]  HPI  Here for wellness and f/u;  Overall doing ok;  Pt denies CP, worsening SOB, DOE, wheezing, orthopnea, PND, worsening LE edema, palpitations, dizziness or syncope.  Pt denies neurological change such as new headache, facial or extremity weakness.  Pt denies polydipsia, polyuria, or low sugar symptoms. Pt states overall good compliance with treatment and medications, good tolerability, and has been trying to follow lower cholesterol diet.  Pt denies worsening depressive symptoms, suicidal ideation or panic. No fever, night sweats, wt loss, loss of appetite, or other constitutional symptoms.  Pt states good ability with ADL's, has low fall risk, home safety reviewed and adequate, no other significant changes in hearing or vision, and only occasionally active with exercise.  No acute complaints Past Medical History  Diagnosis Date  . HYPOGONADISM, MALE 05/12/2008  . HYPERLIPIDEMIA 05/12/2008  . ANXIETY 05/12/2008  . BENIGN PROSTATIC HYPERTROPHY 05/12/2008  . HYPERTENSION 04/04/2010  . 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  reports that he has never smoked. He does not have any smokeless tobacco history on file. He reports that  drinks alcohol. He reports that he does not use illicit drugs. family history includes Arthritis in his mother; Cancer in his brother; Diabetes in his mother; Hypertension in his brother, mother, and sister; and Stroke in his sister. No Known Allergies Current Outpatient Prescriptions on File Prior to Visit  Medication Sig Dispense Refill  . aspirin 81 MG EC tablet Take 81 mg by mouth daily.        . Testosterone 20.25 MG/ACT (1.62%) GEL Place 1 application onto the skin daily.  225 g  1  . sildenafil (VIAGRA) 100 MG tablet Take 1 tablet (100 mg total) by mouth as needed for erectile dysfunction.  10 tablet  5   No current  facility-administered medications on file prior to visit.   Review of Systems Constitutional: Negative for diaphoresis, activity change, appetite change or unexpected weight change.  HENT: Negative for hearing loss, ear pain, facial swelling, mouth sores and neck stiffness.   Eyes: Negative for pain, redness and visual disturbance.  Respiratory: Negative for shortness of breath and wheezing.   Cardiovascular: Negative for chest pain and palpitations.  Gastrointestinal: Negative for diarrhea, blood in stool, abdominal distention or other pain Genitourinary: Negative for hematuria, flank pain or change in urine volume.  Musculoskeletal: Negative for myalgias and joint swelling.  Skin: Negative for color change and wound.  Neurological: Negative for syncope and numbness. other than noted Hematological: Negative for adenopathy.  Psychiatric/Behavioral: Negative for hallucinations, self-injury, decreased concentration and agitation.      Objective:   Physical Exam BP 134/92  Pulse 95  Temp(Src) 98 F (36.7 C) (Oral)  Ht 5\' 10"  (1.778 m)  Wt 173 lb 8 oz (78.699 kg)  BMI 24.89 kg/m2  SpO2 96% VS noted,  Constitutional: Pt is oriented to person, place, and time. Appears well-developed and well-nourished.  Head: Normocephalic and atraumatic.  Right Ear: External ear normal.  Left Ear: External ear normal.  Nose: Nose normal.  Mouth/Throat: Oropharynx is clear and moist.  Eyes: Conjunctivae and EOM are normal. Pupils are equal, round, and reactive to light.  Neck: Normal range of motion. Neck supple. No JVD present. No tracheal deviation present.  Cardiovascular: Normal rate, regular rhythm, normal heart sounds and intact distal pulses.   Pulmonary/Chest: Effort  normal and breath sounds normal.  Abdominal: Soft. Bowel sounds are normal. There is no tenderness. No HSM  Musculoskeletal: Normal range of motion. Exhibits no edema.  Lymphadenopathy:  Has no cervical adenopathy.   Neurological: Pt is alert and oriented to person, place, and time. Pt has normal reflexes. No cranial nerve deficit.  Skin: Skin is warm and dry. No rash noted.  Psychiatric:  Has  normal mood and affect. Behavior is normal.     Assessment & Plan:

## 2012-03-30 NOTE — Assessment & Plan Note (Signed)
Mild elev, cont lipitor 40, for better diet

## 2012-04-05 ENCOUNTER — Other Ambulatory Visit: Payer: Self-pay

## 2012-10-03 ENCOUNTER — Other Ambulatory Visit: Payer: Self-pay | Admitting: Internal Medicine

## 2012-10-03 DIAGNOSIS — Z0000 Encounter for general adult medical examination without abnormal findings: Secondary | ICD-10-CM

## 2012-10-06 ENCOUNTER — Other Ambulatory Visit (INDEPENDENT_AMBULATORY_CARE_PROVIDER_SITE_OTHER): Payer: BC Managed Care – PPO

## 2012-10-06 DIAGNOSIS — Z0000 Encounter for general adult medical examination without abnormal findings: Secondary | ICD-10-CM

## 2012-10-06 LAB — CBC WITH DIFFERENTIAL/PLATELET
Basophils Relative: 0.6 % (ref 0.0–3.0)
Eosinophils Relative: 3.5 % (ref 0.0–5.0)
Lymphocytes Relative: 33.2 % (ref 12.0–46.0)
MCV: 80 fl (ref 78.0–100.0)
Monocytes Absolute: 0.4 10*3/uL (ref 0.1–1.0)
Neutrophils Relative %: 52.6 % (ref 43.0–77.0)
Platelets: 204 10*3/uL (ref 150.0–400.0)
RBC: 6.1 Mil/uL — ABNORMAL HIGH (ref 4.22–5.81)
WBC: 3.8 10*3/uL — ABNORMAL LOW (ref 4.5–10.5)

## 2012-10-06 LAB — URINALYSIS, ROUTINE W REFLEX MICROSCOPIC
Bilirubin Urine: NEGATIVE
Hgb urine dipstick: NEGATIVE
Leukocytes, UA: NEGATIVE
Nitrite: NEGATIVE
Total Protein, Urine: NEGATIVE
WBC, UA: NONE SEEN (ref 0–?)
pH: 7.5 (ref 5.0–8.0)

## 2012-10-06 LAB — BASIC METABOLIC PANEL
BUN: 12 mg/dL (ref 6–23)
CO2: 27 mEq/L (ref 19–32)
Chloride: 100 mEq/L (ref 96–112)
Creatinine, Ser: 0.9 mg/dL (ref 0.4–1.5)
Glucose, Bld: 85 mg/dL (ref 70–99)
Potassium: 4.5 mEq/L (ref 3.5–5.1)

## 2012-10-06 LAB — HEPATIC FUNCTION PANEL
ALT: 38 U/L (ref 0–53)
AST: 26 U/L (ref 0–37)
Albumin: 4.2 g/dL (ref 3.5–5.2)
Total Bilirubin: 0.8 mg/dL (ref 0.3–1.2)
Total Protein: 7.2 g/dL (ref 6.0–8.3)

## 2012-10-06 LAB — LIPID PANEL
Cholesterol: 176 mg/dL (ref 0–200)
LDL Cholesterol: 104 mg/dL — ABNORMAL HIGH (ref 0–99)
Triglycerides: 59 mg/dL (ref 0.0–149.0)

## 2012-10-06 LAB — TSH: TSH: 0.95 u[IU]/mL (ref 0.35–5.50)

## 2012-10-10 ENCOUNTER — Ambulatory Visit (INDEPENDENT_AMBULATORY_CARE_PROVIDER_SITE_OTHER): Payer: BC Managed Care – PPO | Admitting: Internal Medicine

## 2012-10-10 ENCOUNTER — Encounter: Payer: Self-pay | Admitting: Internal Medicine

## 2012-10-10 VITALS — BP 110/72 | HR 102 | Temp 99.0°F | Ht 70.0 in | Wt 176.5 lb

## 2012-10-10 DIAGNOSIS — E291 Testicular hypofunction: Secondary | ICD-10-CM

## 2012-10-10 DIAGNOSIS — Z0000 Encounter for general adult medical examination without abnormal findings: Secondary | ICD-10-CM

## 2012-10-10 DIAGNOSIS — I10 Essential (primary) hypertension: Secondary | ICD-10-CM

## 2012-10-10 NOTE — Patient Instructions (Signed)
Please continue all other medications as before, and refills have been done if requested. Please have the pharmacy call with any other refills you may need. Please continue your efforts at being more active, low cholesterol diet, and weight control. You are otherwise up to date with prevention measures today.  Please remember to sign up for My Chart if you have not done so, as this will be important to you in the future with finding out test results, communicating by private email, and scheduling acute appointments online when needed.  Please return in 1 year for your yearly visit, or sooner if needed, with Lab testing done 3-5 days before

## 2012-10-10 NOTE — Progress Notes (Signed)
Subjective:    Patient ID: Jared Cantrell, male    DOB: 27-Feb-1950, 62 y.o.   MRN: [id number]  HPI  Here for wellness and f/u;  Overall doing ok;  Pt denies CP, worsening SOB, DOE, wheezing, orthopnea, PND, worsening LE edema, palpitations, dizziness or syncope.  Pt denies neurological change such as new headache, facial or extremity weakness.  Pt denies polydipsia, polyuria, or low sugar symptoms. Pt states overall good compliance with treatment and medications, good tolerability, and has been trying to follow lower cholesterol diet.  Pt denies worsening depressive symptoms, suicidal ideation or panic. No fever, night sweats, wt loss, loss of appetite, or other constitutional symptoms.  Pt states good ability with ADL's, has low fall risk, home safety reviewed and adequate, no other significant changes in hearing or vision, and only occasionally active with exercise.  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  No acute complaints. Denies worsening depressive symptoms, suicidal ideation, or panic Past Medical History  Diagnosis Date  . HYPOGONADISM, MALE 05/12/2008  . HYPERLIPIDEMIA 05/12/2008  . ANXIETY 05/12/2008  . BENIGN PROSTATIC HYPERTROPHY 05/12/2008  . HYPERTENSION 04/04/2010  . 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  reports that he has never smoked. He does not have any smokeless tobacco history on file. He reports that  drinks alcohol. He reports that he does not use illicit drugs. family history includes Arthritis in his mother; Cancer in his brother; Diabetes in his mother; Hypertension in his brother, mother, and sister; Stroke in his sister. No Known Allergies Current Outpatient Prescriptions on File Prior to Visit  Medication Sig Dispense Refill  . amitriptyline (ELAVIL) 50 MG tablet Take 1 tablet (50 mg total) by mouth at bedtime.  90 tablet  3  . aspirin 81 MG EC tablet Take 81 mg by mouth daily.        Marland Kitchen atorvastatin (LIPITOR) 40 MG tablet  Take 1 tablet (40 mg total) by mouth daily.  90 tablet  3  . losartan (COZAAR) 100 MG tablet Take 1 tablet (100 mg total) by mouth daily.  90 tablet  3  . Testosterone 20.25 MG/ACT (1.62%) GEL Place 1 application onto the skin daily.  225 g  1  . sildenafil (VIAGRA) 100 MG tablet Take 1 tablet (100 mg total) by mouth as needed for erectile dysfunction.  10 tablet  5   No current facility-administered medications on file prior to visit.   Review of Systems Constitutional: Negative for diaphoresis, activity change, appetite change or unexpected weight change.  HENT: Negative for hearing loss, ear pain, facial swelling, mouth sores and neck stiffness.   Eyes: Negative for pain, redness and visual disturbance.  Respiratory: Negative for shortness of breath and wheezing.   Cardiovascular: Negative for chest pain and palpitations.  Gastrointestinal: Negative for diarrhea, blood in stool, abdominal distention or other pain Genitourinary: Negative for hematuria, flank pain or change in urine volume.  Musculoskeletal: Negative for myalgias and joint swelling.  Skin: Negative for color change and wound.  Neurological: Negative for syncope and numbness. other than noted Hematological: Negative for adenopathy.  Psychiatric/Behavioral: Negative for hallucinations, self-injury, decreased concentration and agitation.      Objective:   Physical Exam BP 110/72  Pulse 102  Temp(Src) 99 F (37.2 C) (Oral)  Ht 5\' 10"  (1.778 m)  Wt 176 lb 8 oz (80.06 kg)  BMI 25.33 kg/m2  SpO2 96% VS noted,  Constitutional: Pt is oriented to person, place, and time.  Appears well-developed and well-nourished.  Head: Normocephalic and atraumatic.  Right Ear: External ear normal.  Left Ear: External ear normal.  Nose: Nose normal.  Mouth/Throat: Oropharynx is clear and moist.  Eyes: Conjunctivae and EOM are normal. Pupils are equal, round, and reactive to light.  Neck: Normal range of motion. Neck supple. No JVD  present. No tracheal deviation present.  Cardiovascular: Normal rate, regular rhythm, normal heart sounds and intact distal pulses.   Pulmonary/Chest: Effort normal and breath sounds normal.  Abdominal: Soft. Bowel sounds are normal. There is no tenderness. No HSM  Musculoskeletal: Normal range of motion. Exhibits no edema.  Lymphadenopathy:  Has no cervical adenopathy.  Neurological: Pt is alert and oriented to person, place, and time. Pt has normal reflexes. No cranial nerve deficit.  Skin: Skin is warm and dry. No rash noted.  Psychiatric:  Has  normal mood and affect. Behavior is normal.     Assessment & Plan:

## 2012-10-11 NOTE — Assessment & Plan Note (Signed)
stable overall by history and exam, recent data reviewed with pt, and pt to continue medical treatment as before,  to f/u any worsening symptoms or concerns BP Readings from Last 3 Encounters:  10/10/12 110/72  03/25/12 134/92  03/23/11 122/82

## 2012-10-11 NOTE — Assessment & Plan Note (Signed)

## 2012-10-11 NOTE — Assessment & Plan Note (Signed)
For testost level

## 2012-12-25 ENCOUNTER — Other Ambulatory Visit: Payer: Self-pay

## 2013-04-13 ENCOUNTER — Other Ambulatory Visit: Payer: Self-pay | Admitting: Internal Medicine

## 2013-04-22 ENCOUNTER — Encounter: Payer: Self-pay | Admitting: Internal Medicine

## 2013-07-06 ENCOUNTER — Other Ambulatory Visit: Payer: Self-pay | Admitting: Internal Medicine

## 2013-09-21 ENCOUNTER — Encounter: Payer: Self-pay | Admitting: Internal Medicine

## 2013-11-17 ENCOUNTER — Ambulatory Visit (AMBULATORY_SURGERY_CENTER): Payer: Self-pay

## 2013-11-17 VITALS — Ht 70.0 in | Wt 178.4 lb

## 2013-11-17 DIAGNOSIS — Z1211 Encounter for screening for malignant neoplasm of colon: Secondary | ICD-10-CM

## 2013-11-17 NOTE — Progress Notes (Signed)
No past problems with anesthesia No home oxgyen No allergies to eggs or soy No diet/weight loss meds  Has email Emmi instructions given for colonoscopy

## 2013-11-24 ENCOUNTER — Encounter: Payer: Self-pay | Admitting: Internal Medicine

## 2013-11-25 ENCOUNTER — Encounter: Payer: Self-pay | Admitting: Internal Medicine

## 2013-11-25 ENCOUNTER — Ambulatory Visit (INDEPENDENT_AMBULATORY_CARE_PROVIDER_SITE_OTHER): Payer: BC Managed Care – PPO | Admitting: Internal Medicine

## 2013-11-25 ENCOUNTER — Other Ambulatory Visit (INDEPENDENT_AMBULATORY_CARE_PROVIDER_SITE_OTHER): Payer: BC Managed Care – PPO

## 2013-11-25 VITALS — BP 110/72 | HR 77 | Temp 97.6°F | Ht 70.0 in | Wt 174.2 lb

## 2013-11-25 DIAGNOSIS — Z0000 Encounter for general adult medical examination without abnormal findings: Secondary | ICD-10-CM

## 2013-11-25 LAB — BASIC METABOLIC PANEL
BUN: 9 mg/dL (ref 6–23)
CALCIUM: 10.4 mg/dL (ref 8.4–10.5)
CO2: 26 mEq/L (ref 19–32)
CREATININE: 1.1 mg/dL (ref 0.4–1.5)
Chloride: 100 mEq/L (ref 96–112)
GFR: 86.64 mL/min (ref >60.00)
GLUCOSE: 84 mg/dL (ref 70–99)
Potassium: 4.4 mEq/L (ref 3.5–5.1)
Sodium: 136 mEq/L (ref 135–145)

## 2013-11-25 LAB — URINALYSIS, ROUTINE W REFLEX MICROSCOPIC
HGB URINE DIPSTICK: NEGATIVE
KETONES UR: 40 — AB
Leukocytes, UA: NEGATIVE
Nitrite: NEGATIVE
PH: 6 (ref 5.0–8.0)
Specific Gravity, Urine: 1.025 (ref 1.000–1.030)
TOTAL PROTEIN, URINE-UPE24: NEGATIVE
Urine Glucose: NEGATIVE
Urobilinogen, UA: 0.2 (ref 0.0–1.0)

## 2013-11-25 LAB — CBC WITH DIFFERENTIAL/PLATELET
BASOS ABS: 0 10*3/uL (ref 0.0–0.1)
BASOS PCT: 0.5 % (ref 0.0–3.0)
EOS ABS: 0.1 10*3/uL (ref 0.0–0.7)
Eosinophils Relative: 3.4 % (ref 0.0–5.0)
HCT: 55.3 % — ABNORMAL HIGH (ref 39.0–52.0)
HEMOGLOBIN: 17.2 g/dL — AB (ref 13.0–17.0)
LYMPHS PCT: 42.4 % (ref 12.0–46.0)
Lymphs Abs: 1.8 10*3/uL (ref 0.7–4.0)
MCHC: 31.1 g/dL (ref 30.0–36.0)
MCV: 82.3 fl (ref 78.0–100.0)
MONOS PCT: 10.9 % (ref 3.0–12.0)
Monocytes Absolute: 0.5 10*3/uL (ref 0.1–1.0)
Neutro Abs: 1.8 10*3/uL (ref 1.4–7.7)
Neutrophils Relative %: 42.8 % — ABNORMAL LOW (ref 43.0–77.0)
Platelets: 198 10*3/uL (ref 150.0–400.0)
RBC: 6.72 Mil/uL — AB (ref 4.22–5.81)
RDW: 15.8 % — AB (ref 11.5–15.5)
WBC: 4.3 10*3/uL (ref 4.0–10.5)

## 2013-11-25 LAB — LIPID PANEL
CHOLESTEROL: 148 mg/dL (ref 0–200)
HDL: 45.6 mg/dL (ref >39.00)
LDL Cholesterol: 90 mg/dL (ref 0–99)
NonHDL: 102.4
TRIGLYCERIDES: 64 mg/dL (ref 0.0–149.0)
Total CHOL/HDL Ratio: 3
VLDL: 12.8 mg/dL (ref 0.0–40.0)

## 2013-11-25 LAB — HEPATIC FUNCTION PANEL
ALBUMIN: 3.9 g/dL (ref 3.5–5.2)
ALT: 28 U/L (ref 0–53)
AST: 26 U/L (ref 0–37)
Alkaline Phosphatase: 69 U/L (ref 39–117)
Bilirubin, Direct: 0.2 mg/dL (ref 0.0–0.3)
TOTAL PROTEIN: 7.8 g/dL (ref 6.0–8.3)
Total Bilirubin: 1.2 mg/dL (ref 0.2–1.2)

## 2013-11-25 MED ORDER — LOSARTAN POTASSIUM 100 MG PO TABS: 100.0000 mg | ORAL_TABLET | Freq: Every day | ORAL | Status: DC

## 2013-11-25 MED ORDER — ATORVASTATIN CALCIUM 40 MG PO TABS: 40.0000 mg | ORAL_TABLET | Freq: Every day | ORAL | Status: DC

## 2013-11-25 NOTE — Assessment & Plan Note (Signed)

## 2013-11-25 NOTE — Patient Instructions (Signed)
Please continue all other medications as before, and refills have been done if requested.  Please have the pharmacy call with any other refills you may need.  Please continue your efforts at being more active, low cholesterol diet, and weight control.  You are otherwise up to date with prevention measures today.  Please keep your appointments with your specialists as you may have planned - urology  Please go to the LAB in the Basement (turn left off the elevator) for the tests to be done today  You will be contacted by phone if any changes need to be made immediately.  Otherwise, you will receive a letter about your results with an explanation, but please check with MyChart first.  Please remember to sign up for MyChart if you have not done so, as this will be important to you in the future with finding out test results, communicating by private email, and scheduling acute appointments online when needed.  Please return in 1 year for your yearly visit, or sooner if needed, with Lab testing done 3-5 days before   

## 2013-11-25 NOTE — Progress Notes (Signed)
Subjective:    Patient ID: Jared Cantrell, male    DOB: 07-Feb-1951, 63 y.o.   MRN: [id number]  HPI  Here for wellness and f/u;  Overall doing ok;  Pt denies CP, worsening SOB, DOE, wheezing, orthopnea, PND, worsening LE edema, palpitations, dizziness or syncope.  Pt denies neurological change such as new headache, facial or extremity weakness.  Pt denies polydipsia, polyuria, or low sugar symptoms. Pt states overall good compliance with treatment and medications, good tolerability, and has been trying to follow lower cholesterol diet.  Pt denies worsening depressive symptoms, suicidal ideation or panic. No fever, night sweats, wt loss, loss of appetite, or other constitutional symptoms.  Pt states good ability with ADL's, has low fall risk, home safety reviewed and adequate, no other significant changes in hearing or vision, and occasionally active with exercise, lost a few lbs with diet. No complaints. Sees urology for PSA and hypogonadism with planned f/u about jan 2016 Wt Readings from Last 3 Encounters:  11/25/13 174 lb 4 oz (79.039 kg)  11/17/13 178 lb 6.4 oz (80.922 kg)  10/10/12 176 lb 8 oz (80.06 kg)   Past Medical History  Diagnosis Date  . HYPOGONADISM, MALE 05/12/2008  . HYPERLIPIDEMIA 05/12/2008  . ANXIETY 05/12/2008  . BENIGN PROSTATIC HYPERTROPHY 05/12/2008  . HYPERTENSION 04/04/2010  . 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 Colonoscopy    . Wisdom tooth extraction      reports that he has never smoked. He has never used smokeless tobacco. He reports that he drinks alcohol. He reports that he does not use illicit drugs. family history includes Arthritis in his mother; Cancer in his brother; Diabetes in his mother; Hypertension in his brother, mother, and sister; Stroke in his sister. There is no history of Colon cancer, Pancreatic cancer, Rectal cancer, or Stomach cancer. No Known Allergies Current Outpatient  Prescriptions on File Prior to Visit  Medication Sig Dispense Refill  . amitriptyline (ELAVIL) 50 MG tablet Take 1 tablet (50 mg total) by mouth at bedtime.  90 tablet  3  . aspirin 81 MG EC tablet Take 81 mg by mouth daily.        . Testosterone (ANDROGEL) 40.5 MG/2.5GM (1.62%) GEL Place onto the skin.      . sildenafil (VIAGRA) 100 MG tablet Take 1 tablet (100 mg total) by mouth as needed for erectile dysfunction.  10 tablet  5  . Testosterone 20.25 MG/ACT (1.62%) GEL Place 1 application onto the skin daily.  225 g  1   No current facility-administered medications on file prior to visit.   Sched'd for colonoscopy next wk.  Declines flu shot.  Review of Systems Constitutional: Negative for increased diaphoresis, other activity, appetite or other siginficant weight change  HENT: Negative for worsening hearing loss, ear pain, facial swelling, mouth sores and neck stiffness.   Eyes: Negative for other worsening pain, redness or visual disturbance.  Respiratory: Negative for shortness of breath and wheezing.   Cardiovascular: Negative for chest pain and palpitations.  Gastrointestinal: Negative for diarrhea, blood in stool, abdominal distention or other pain Genitourinary: Negative for hematuria, flank pain or change in urine volume.  Musculoskeletal: Negative for myalgias or other joint complaints.  Skin: Negative for color change and wound.  Neurological: Negative for syncope and numbness. other than noted Hematological: Negative for adenopathy. or other swelling Psychiatric/Behavioral: Negative for hallucinations, self-injury, decreased concentration or other worsening agitation.  Objective:   Physical Exam BP 110/72  Pulse 77  Temp(Src) 97.6 F (36.4 C) (Oral)  Ht 5\' 10"  (1.778 m)  Wt 174 lb 4 oz (79.039 kg)  BMI 25.00 kg/m2  SpO2 94% VS noted,  Constitutional: Pt is oriented to person, place, and time. Appears well-developed and well-nourished.  Head: Normocephalic and  atraumatic.  Right Ear: External ear normal.  Left Ear: External ear normal.  Nose: Nose normal.  Mouth/Throat: Oropharynx is clear and moist.  Eyes: Conjunctivae and EOM are normal. Pupils are equal, round, and reactive to light.  Neck: Normal range of motion. Neck supple. No JVD present. No tracheal deviation present.  Cardiovascular: Normal rate, regular rhythm, normal heart sounds and intact distal pulses.   Pulmonary/Chest: Effort normal and breath sounds without rales or wheezing  Abdominal: Soft. Bowel sounds are normal. NT. No HSM  Musculoskeletal: Normal range of motion. Exhibits no edema.  Lymphadenopathy:  Has no cervical adenopathy.  Neurological: Pt is alert and oriented to person, place, and time. Pt has normal reflexes. No cranial nerve deficit. Motor grossly intact Skin: Skin is warm and dry. No rash noted.  Psychiatric:  Has normal mood and affect. Behavior is normal.     Assessment & Plan:

## 2013-11-26 ENCOUNTER — Encounter: Payer: Self-pay | Admitting: Internal Medicine

## 2013-11-26 ENCOUNTER — Other Ambulatory Visit: Payer: Self-pay | Admitting: Internal Medicine

## 2013-11-26 DIAGNOSIS — D751 Secondary polycythemia: Secondary | ICD-10-CM

## 2013-11-26 LAB — TSH: TSH: 0.8 u[IU]/mL (ref 0.35–4.50)

## 2013-11-30 ENCOUNTER — Telehealth: Payer: Self-pay | Admitting: Internal Medicine

## 2013-11-30 NOTE — Telephone Encounter (Signed)
Patient is returning your call.  phone # [phone]

## 2013-12-01 ENCOUNTER — Encounter: Payer: Self-pay | Admitting: Internal Medicine

## 2013-12-01 ENCOUNTER — Ambulatory Visit (AMBULATORY_SURGERY_CENTER): Payer: BC Managed Care – PPO | Admitting: Internal Medicine

## 2013-12-01 VITALS — BP 119/89 | HR 76 | Temp 96.6°F | Resp 16 | Ht 70.0 in | Wt 178.0 lb

## 2013-12-01 DIAGNOSIS — Z1211 Encounter for screening for malignant neoplasm of colon: Secondary | ICD-10-CM

## 2013-12-01 MED ORDER — SODIUM CHLORIDE 0.9 % IV SOLN: 500.0000 mL | INTRAVENOUS | Status: DC

## 2013-12-01 NOTE — Progress Notes (Signed)
A/ox3 pleased with MAC, report to Wendy RN 

## 2013-12-01 NOTE — Patient Instructions (Addendum)
Your colonoscopy was normal.  Next routine colonoscopy in 10 years - 2025  I appreciate the opportunity to care for you. Gatha Mayer, MD, FACG  YOU HAD AN ENDOSCOPIC PROCEDURE TODAY AT [facility]: Refer to the procedure report that was given to you for any specific questions about what was found during the examination.  If the procedure report does not answer your questions, please call your gastroenterologist to clarify.  If you requested that your care partner not be given the details of your procedure findings, then the procedure report has been included in a sealed envelope for you to review at your convenience later.  YOU SHOULD EXPECT: Some feelings of bloating in the abdomen. Passage of more gas than usual.  Walking can help get rid of the air that was put into your GI tract during the procedure and reduce the bloating. If you had a lower endoscopy (such as a colonoscopy or flexible sigmoidoscopy) you may notice spotting of blood in your stool or on the toilet paper. If you underwent a bowel prep for your procedure, then you may not have a normal bowel movement for a few days.  DIET: Your first meal following the procedure should be a light meal and then it is ok to progress to your normal diet.  A half-sandwich or bowl of soup is an example of a good first meal.  Heavy or fried foods are harder to digest and may make you feel nauseous or bloated.  Likewise meals heavy in dairy and vegetables can cause extra gas to form and this can also increase the bloating.  Drink plenty of fluids but you should avoid alcoholic beverages for 24 hours.  ACTIVITY: Your care partner should take you home directly after the procedure.  You should plan to take it easy, moving slowly for the rest of the day.  You can resume normal activity the day after the procedure however you should NOT DRIVE or use heavy machinery for 24 hours (because of the sedation medicines used during the  test).    SYMPTOMS TO REPORT IMMEDIATELY: A gastroenterologist can be reached at any hour.  During normal business hours, 8:30 AM to 5:00 PM Monday through Friday, call [phone].  After hours and on weekends, please call the GI answering service at [phone] who will take a message and have the physician on call contact you.   Following lower endoscopy (colonoscopy or flexible sigmoidoscopy):  Excessive amounts of blood in the stool  Significant tenderness or worsening of abdominal pains  Swelling of the abdomen that is new, acute  Fever of 100F or higher  FOLLOW UP: If any biopsies were taken you will be contacted by phone or by letter within the next 1-3 weeks.  Call your gastroenterologist if you have not heard about the biopsies in 3 weeks.  Our staff will call the home number listed on your records the next business day following your procedure to check on you and address any questions or concerns that you may have at that time regarding the information given to you following your procedure. This is a courtesy call and so if there is no answer at the home number and we have not heard from you through the emergency physician on call, we will assume that you have returned to your regular daily activities without incident.  SIGNATURES/CONFIDENTIALITY: You and/or your care partner have signed paperwork which will be entered into your electronic medical record.  These  signatures attest to the fact that that the information above on your After Visit Summary has been reviewed and is understood.  Full responsibility of the confidentiality of this discharge information lies with you and/or your care-partner.  Recommendations Next routine colonoscopy in 10 years.

## 2013-12-01 NOTE — Op Note (Signed)
[place]  [address] [place], [id number]   COLONOSCOPY PROCEDURE REPORT  PATIENT: Jared Cantrell, Jared Cantrell  MR#: [id number] BIRTHDATE: [birth year] , 62  yrs. old GENDER: male ENDOSCOPIST: Gatha Mayer, MD, [facility] PROCEDURE DATE:  12/01/2013 PROCEDURE:   Colonoscopy, screening First Screening Colonoscopy - Avg.  risk and is 50 yrs.  old or older - No.  Prior Negative Screening - Now for repeat screening. N/A  History of Adenoma - Now for follow-up colonoscopy & has been > or = to 3 yrs.  N/A ASA CLASS:   Class II INDICATIONS:average risk for colorectal cancer and last colonoscopy completed 10 years ago. MEDICATIONS: Propofol 200 mg IV and Monitored anesthesia care  DESCRIPTION OF PROCEDURE:   After the risks benefits and alternatives of the procedure were thoroughly explained, informed consent was obtained.  The digital rectal exam revealed no abnormalities of the rectum, revealed no prostatic nodules, and revealed the prostate was not enlarged.   The [id number] endoscope was introduced through the anus and advanced to the cecum, which was identified by both the appendix and ileocecal valve. No adverse events experienced.   The quality of the prep was adequate, using MiraLax  The instrument was then slowly withdrawn as the colon was fully examined.      COLON FINDINGS: A normal appearing cecum, ileocecal valve, and appendiceal orifice were identified.  The ascending, transverse, descending, sigmoid colon, and rectum appeared unremarkable. Right colon retroflexion included.  Retroflexed views revealed no abnormalities. The time to cecum=5 minutes 12 seconds.  Withdrawal time=13 minutes 15 seconds.  The scope was withdrawn and the procedure completed. COMPLICATIONS: There were no immediate complications.  ENDOSCOPIC IMPRESSION: Normal colonoscopy  RECOMMENDATIONS: Repeat colonoscopy 10 years.  eSigned:  Gatha Mayer, MD, [facility] 12/01/2013 9:52  AM   cc: The Patient

## 2013-12-01 NOTE — Telephone Encounter (Signed)
Patient has been informed of results from my chart.

## 2013-12-02 ENCOUNTER — Telehealth: Payer: Self-pay | Admitting: *Deleted

## 2013-12-02 NOTE — Telephone Encounter (Signed)
Left message that we called for f/u 

## 2013-12-26 IMAGING — CR DG CHEST 2V
2 series · 2 of 2 positions shown · non-contrast
Comparison: None.

CLINICAL DATA: Physical examination.  Hypercalcemia.

CHEST - 2 VIEW

[view not recorded (1 of 2)]
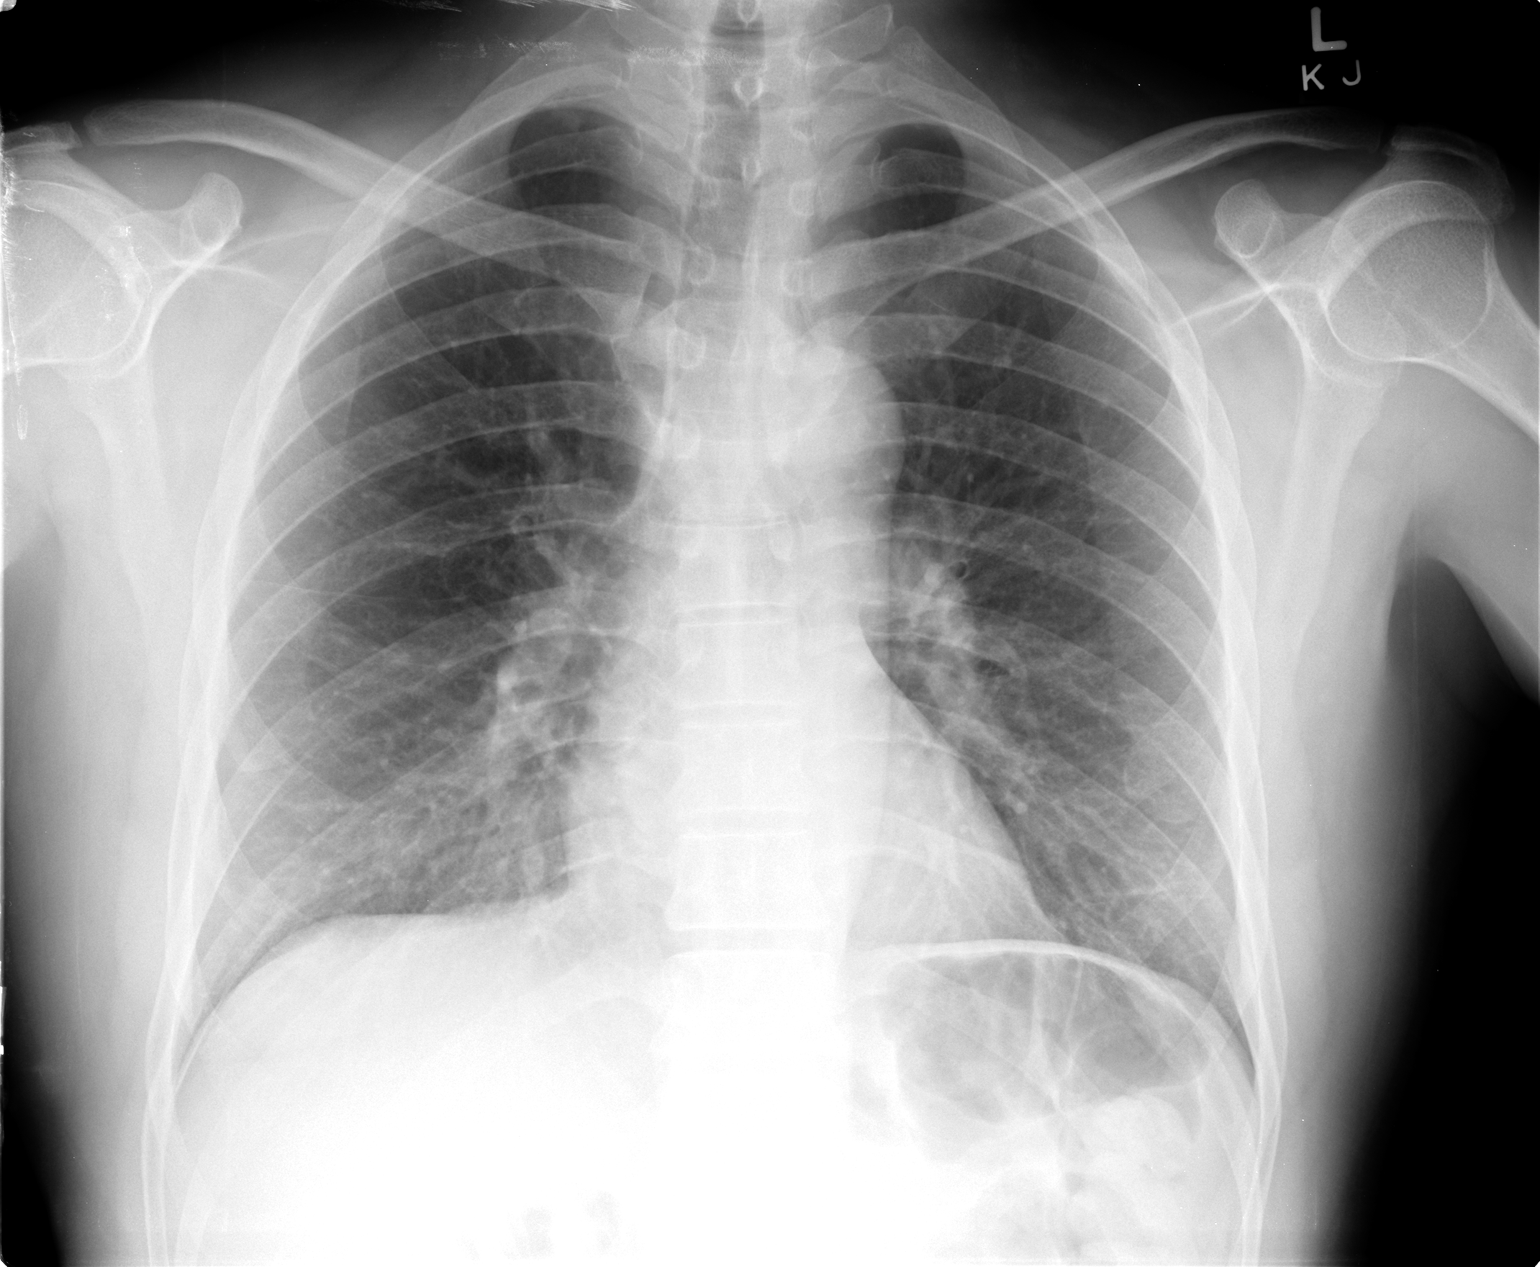

[view not recorded (2 of 2)]
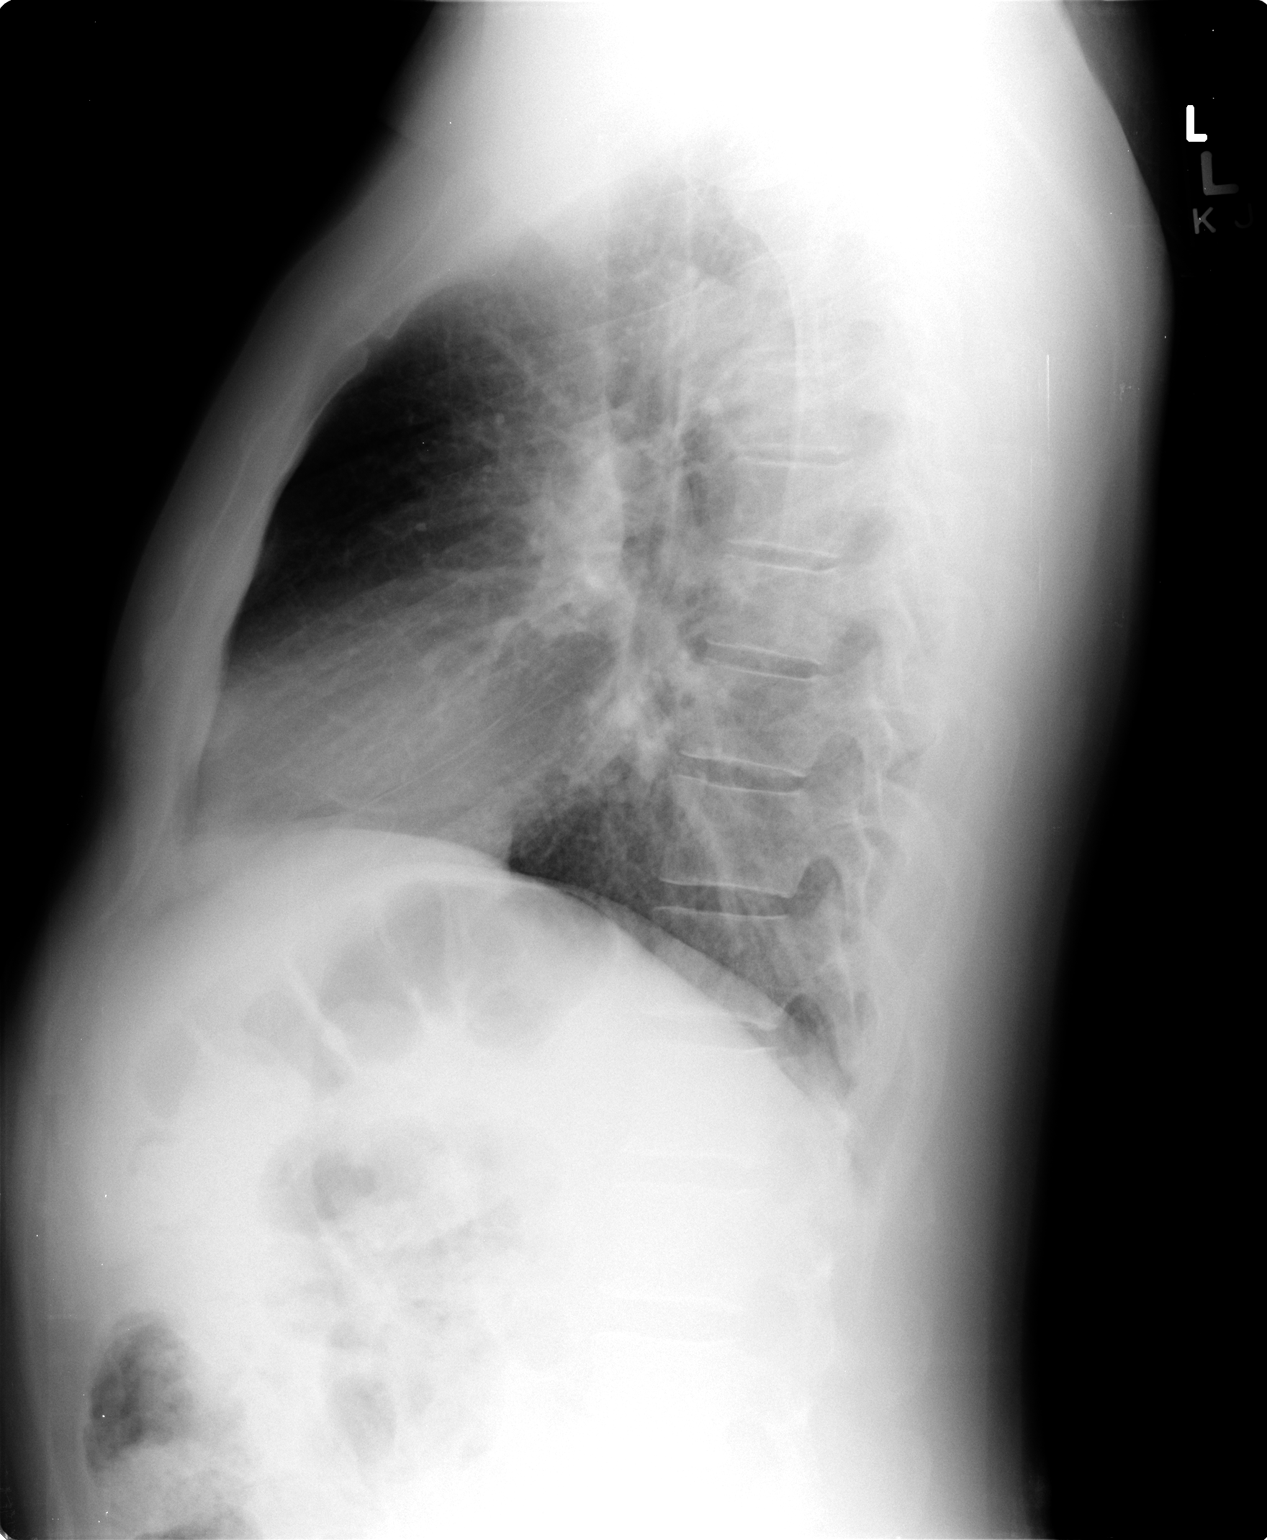

[2 of 2 positions shown; findings below may reference images not displayed]

FINDINGS: Cardiopericardial silhouette within normal limits.
Mediastinal contours normal. Trachea midline.  No airspace disease
or effusion.  Subsegmental atelectasis or scarring at the left
base.
IMPRESSION: No active cardiopulmonary disease.

## 2013-12-30 ENCOUNTER — Encounter: Payer: Self-pay | Admitting: Internal Medicine

## 2014-01-01 ENCOUNTER — Other Ambulatory Visit (INDEPENDENT_AMBULATORY_CARE_PROVIDER_SITE_OTHER): Payer: BC Managed Care – PPO

## 2014-01-01 DIAGNOSIS — D751 Secondary polycythemia: Secondary | ICD-10-CM

## 2014-01-01 DIAGNOSIS — Z0000 Encounter for general adult medical examination without abnormal findings: Secondary | ICD-10-CM

## 2014-01-01 LAB — LIPID PANEL
CHOLESTEROL: 174 mg/dL (ref 0–200)
HDL: 50.1 mg/dL (ref >39.00)
LDL CALC: 112 mg/dL — AB (ref 0–99)
NonHDL: 123.9
Total CHOL/HDL Ratio: 3
Triglycerides: 58 mg/dL (ref 0.0–149.0)
VLDL: 11.6 mg/dL (ref 0.0–40.0)

## 2014-01-01 LAB — HEPATIC FUNCTION PANEL
ALT: 32 U/L (ref 0–53)
AST: 21 U/L (ref 0–37)
Albumin: 3.6 g/dL (ref 3.5–5.2)
Alkaline Phosphatase: 63 U/L (ref 39–117)
BILIRUBIN DIRECT: 0.1 mg/dL (ref 0.0–0.3)
BILIRUBIN TOTAL: 0.6 mg/dL (ref 0.2–1.2)
Total Protein: 6.8 g/dL (ref 6.0–8.3)

## 2014-01-01 LAB — URINALYSIS, ROUTINE W REFLEX MICROSCOPIC
Bilirubin Urine: NEGATIVE
HGB URINE DIPSTICK: NEGATIVE
KETONES UR: NEGATIVE
Leukocytes, UA: NEGATIVE
Nitrite: NEGATIVE
Specific Gravity, Urine: 1.015 (ref 1.000–1.030)
TOTAL PROTEIN, URINE-UPE24: NEGATIVE
UROBILINOGEN UA: 0.2 (ref 0.0–1.0)
Urine Glucose: NEGATIVE
pH: 7.5 (ref 5.0–8.0)

## 2014-01-01 LAB — BASIC METABOLIC PANEL
BUN: 13 mg/dL (ref 6–23)
CO2: 26 mEq/L (ref 19–32)
CREATININE: 1.1 mg/dL (ref 0.4–1.5)
Calcium: 10.4 mg/dL (ref 8.4–10.5)
Chloride: 101 mEq/L (ref 96–112)
GFR: 90.36 mL/min (ref >60.00)
GLUCOSE: 91 mg/dL (ref 70–99)
Potassium: 4.5 mEq/L (ref 3.5–5.1)
Sodium: 135 mEq/L (ref 135–145)

## 2014-01-01 LAB — PSA: PSA: 0.51 ng/mL (ref 0.10–4.00)

## 2014-01-01 LAB — CBC WITH DIFFERENTIAL/PLATELET
BASOS PCT: 0.4 % (ref 0.0–3.0)
Basophils Absolute: 0 10*3/uL (ref 0.0–0.1)
Eosinophils Absolute: 0.2 10*3/uL (ref 0.0–0.7)
Eosinophils Relative: 5.4 % — ABNORMAL HIGH (ref 0.0–5.0)
HCT: 50.9 % (ref 39.0–52.0)
HEMOGLOBIN: 16.4 g/dL (ref 13.0–17.0)
Lymphocytes Relative: 36.5 % (ref 12.0–46.0)
Lymphs Abs: 1.6 10*3/uL (ref 0.7–4.0)
MCHC: 32.2 g/dL (ref 30.0–36.0)
MCV: 80.5 fl (ref 78.0–100.0)
MONO ABS: 0.4 10*3/uL (ref 0.1–1.0)
Monocytes Relative: 9.1 % (ref 3.0–12.0)
NEUTROS PCT: 48.6 % (ref 43.0–77.0)
Neutro Abs: 2.2 10*3/uL (ref 1.4–7.7)
Platelets: 203 10*3/uL (ref 150.0–400.0)
RBC: 6.32 Mil/uL — AB (ref 4.22–5.81)
RDW: 15.3 % (ref 11.5–15.5)
WBC: 4.4 10*3/uL (ref 4.0–10.5)

## 2014-01-01 LAB — TSH: TSH: 1.09 u[IU]/mL (ref 0.35–4.50)

## 2014-11-30 ENCOUNTER — Telehealth: Payer: Self-pay | Admitting: Internal Medicine

## 2014-11-30 NOTE — Telephone Encounter (Signed)
Pt made a Mychart request for refill for his losartin and wanted to it to go to [facility]. I left him a vm to see which [facility]

## 2014-12-01 MED ORDER — LOSARTAN POTASSIUM 100 MG PO TABS: 100.0000 mg | ORAL_TABLET | Freq: Every day | ORAL | Status: DC

## 2014-12-01 NOTE — Telephone Encounter (Signed)
Pharmacy is [facility] on [address] and [address]

## 2014-12-24 ENCOUNTER — Telehealth: Payer: Self-pay

## 2014-12-24 DIAGNOSIS — Z0000 Encounter for general adult medical examination without abnormal findings: Secondary | ICD-10-CM

## 2014-12-24 NOTE — Telephone Encounter (Signed)
Pt request labs prior to upcoming CPE

## 2014-12-29 ENCOUNTER — Other Ambulatory Visit (INDEPENDENT_AMBULATORY_CARE_PROVIDER_SITE_OTHER): Payer: BLUE CROSS/BLUE SHIELD

## 2014-12-29 ENCOUNTER — Telehealth: Payer: Self-pay

## 2014-12-29 DIAGNOSIS — Z0000 Encounter for general adult medical examination without abnormal findings: Secondary | ICD-10-CM

## 2014-12-29 LAB — CBC WITH DIFFERENTIAL/PLATELET
BASOS ABS: 0 10*3/uL (ref 0.0–0.1)
Basophils Relative: 0.3 % (ref 0.0–3.0)
EOS ABS: 0.2 10*3/uL (ref 0.0–0.7)
EOS PCT: 3.2 % (ref 0.0–5.0)
HCT: 57.7 % — ABNORMAL HIGH (ref 39.0–52.0)
Lymphocytes Relative: 20.5 % (ref 12.0–46.0)
Lymphs Abs: 1.1 10*3/uL (ref 0.7–4.0)
MCHC: 31.5 g/dL (ref 30.0–36.0)
MCV: 82.2 fl (ref 78.0–100.0)
MONO ABS: 0.6 10*3/uL (ref 0.1–1.0)
Monocytes Relative: 11.2 % (ref 3.0–12.0)
Neutro Abs: 3.4 10*3/uL (ref 1.4–7.7)
Neutrophils Relative %: 64.8 % (ref 43.0–77.0)
Platelets: 196 10*3/uL (ref 150.0–400.0)
RBC: 7.02 Mil/uL — ABNORMAL HIGH (ref 4.22–5.81)
RDW: 15.9 % — ABNORMAL HIGH (ref 11.5–15.5)
WBC: 5.2 10*3/uL (ref 4.0–10.5)

## 2014-12-29 LAB — URINALYSIS, ROUTINE W REFLEX MICROSCOPIC
BILIRUBIN URINE: NEGATIVE
HGB URINE DIPSTICK: NEGATIVE
Ketones, ur: NEGATIVE
LEUKOCYTES UA: NEGATIVE
NITRITE: NEGATIVE
RBC / HPF: NONE SEEN (ref 0–?)
Specific Gravity, Urine: 1.025 (ref 1.000–1.030)
Total Protein, Urine: NEGATIVE
URINE GLUCOSE: NEGATIVE
UROBILINOGEN UA: 0.2 (ref 0.0–1.0)
WBC, UA: NONE SEEN (ref 0–?)
pH: 6 (ref 5.0–8.0)

## 2014-12-29 LAB — BASIC METABOLIC PANEL
BUN: 15 mg/dL (ref 6–23)
CALCIUM: 10.9 mg/dL — AB (ref 8.4–10.5)
CO2: 28 mEq/L (ref 19–32)
Chloride: 99 mEq/L (ref 96–112)
Creatinine, Ser: 1.18 mg/dL (ref 0.40–1.50)
GFR: 80.45 mL/min (ref >60.00)
GLUCOSE: 94 mg/dL (ref 70–99)
Potassium: 4.8 mEq/L (ref 3.5–5.1)
Sodium: 136 mEq/L (ref 135–145)

## 2014-12-29 LAB — HEPATIC FUNCTION PANEL
ALK PHOS: 65 U/L (ref 39–117)
ALT: 41 U/L (ref 0–53)
AST: 27 U/L (ref 0–37)
Albumin: 4.5 g/dL (ref 3.5–5.2)
BILIRUBIN DIRECT: 0.2 mg/dL (ref 0.0–0.3)
BILIRUBIN TOTAL: 1.1 mg/dL (ref 0.2–1.2)
Total Protein: 7.6 g/dL (ref 6.0–8.3)

## 2014-12-29 LAB — LIPID PANEL
CHOL/HDL RATIO: 4
Cholesterol: 215 mg/dL — ABNORMAL HIGH (ref 0–200)
HDL: 60.7 mg/dL (ref >39.00)
LDL CALC: 136 mg/dL — AB (ref 0–99)
NONHDL: 154.32
Triglycerides: 91 mg/dL (ref 0.0–149.0)
VLDL: 18.2 mg/dL (ref 0.0–40.0)

## 2014-12-29 LAB — PSA: PSA: 0.64 ng/mL (ref 0.10–4.00)

## 2014-12-29 LAB — TSH: TSH: 1.38 u[IU]/mL (ref 0.35–4.50)

## 2014-12-29 NOTE — Telephone Encounter (Signed)
Per karen/lab, critical lab value hgb 18.2 and hct 57.7 for this patient, please advise, thanks

## 2014-12-29 NOTE — Telephone Encounter (Signed)
Ok to evaluate at [facility] nov 11

## 2014-12-31 ENCOUNTER — Other Ambulatory Visit (INDEPENDENT_AMBULATORY_CARE_PROVIDER_SITE_OTHER): Payer: BLUE CROSS/BLUE SHIELD

## 2014-12-31 ENCOUNTER — Ambulatory Visit (INDEPENDENT_AMBULATORY_CARE_PROVIDER_SITE_OTHER): Payer: BLUE CROSS/BLUE SHIELD | Admitting: Internal Medicine

## 2014-12-31 ENCOUNTER — Encounter: Payer: Self-pay | Admitting: Internal Medicine

## 2014-12-31 VITALS — BP 122/82 | HR 84 | Temp 98.2°F | Ht 70.0 in | Wt 183.0 lb

## 2014-12-31 DIAGNOSIS — D751 Secondary polycythemia: Secondary | ICD-10-CM | POA: Diagnosis not present

## 2014-12-31 DIAGNOSIS — Z0000 Encounter for general adult medical examination without abnormal findings: Secondary | ICD-10-CM | POA: Diagnosis not present

## 2014-12-31 DIAGNOSIS — E291 Testicular hypofunction: Secondary | ICD-10-CM | POA: Diagnosis not present

## 2014-12-31 DIAGNOSIS — E785 Hyperlipidemia, unspecified: Secondary | ICD-10-CM

## 2014-12-31 DIAGNOSIS — I10 Essential (primary) hypertension: Secondary | ICD-10-CM

## 2014-12-31 LAB — CBC WITH DIFFERENTIAL/PLATELET
Basophils Absolute: 0 10*3/uL (ref 0.0–0.1)
Basophils Relative: 0.8 % (ref 0.0–3.0)
EOS ABS: 0.2 10*3/uL (ref 0.0–0.7)
EOS PCT: 5.3 % — AB (ref 0.0–5.0)
HCT: 54.4 % — ABNORMAL HIGH (ref 39.0–52.0)
Hemoglobin: 17.3 g/dL — ABNORMAL HIGH (ref 13.0–17.0)
LYMPHS ABS: 1.3 10*3/uL (ref 0.7–4.0)
Lymphocytes Relative: 34.8 % (ref 12.0–46.0)
MCHC: 31.7 g/dL (ref 30.0–36.0)
MCV: 82.3 fl (ref 78.0–100.0)
MONO ABS: 0.5 10*3/uL (ref 0.1–1.0)
Monocytes Relative: 12.8 % — ABNORMAL HIGH (ref 3.0–12.0)
NEUTROS PCT: 46.3 % (ref 43.0–77.0)
Neutro Abs: 1.8 10*3/uL (ref 1.4–7.7)
Platelets: 195 10*3/uL (ref 150.0–400.0)
RBC: 6.61 Mil/uL — AB (ref 4.22–5.81)
RDW: 16.1 % — AB (ref 11.5–15.5)
WBC: 3.8 10*3/uL — ABNORMAL LOW (ref 4.0–10.5)

## 2014-12-31 LAB — TESTOSTERONE: TESTOSTERONE: 458.57 ng/dL (ref 300.00–890.00)

## 2014-12-31 MED ORDER — ATORVASTATIN CALCIUM 40 MG PO TABS
40.0000 mg | ORAL_TABLET | Freq: Every day | ORAL | Status: DC
Start: 2014-12-31 — End: 2016-01-04

## 2014-12-31 MED ORDER — LOSARTAN POTASSIUM 100 MG PO TABS: 100.0000 mg | ORAL_TABLET | Freq: Every day | ORAL | Status: DC

## 2014-12-31 NOTE — Assessment & Plan Note (Signed)
stable overall by history and exam, recent data reviewed with pt, and pt to continue medical treatment as before,  to f/u any worsening symptoms or concerns BP Readings from Last 3 Encounters:  12/31/14 122/82  12/01/13 119/89  11/25/13 110/72

## 2014-12-31 NOTE — Assessment & Plan Note (Signed)
Mild, ? Clinical significance, for PTH level and repeat Ca

## 2014-12-31 NOTE — Progress Notes (Signed)
Pre visit review using our clinic review tool, if applicable. No additional management support is needed unless otherwise documented below in the visit note. 

## 2014-12-31 NOTE — Assessment & Plan Note (Signed)
Mild worsening recently due to diet, for lower chol diet, consider statin if not improved

## 2014-12-31 NOTE — Patient Instructions (Signed)
Please continue all other medications as before, and refills have been done if requested.  Please have the pharmacy call with any other refills you may need.  Please continue your efforts at being more active, low cholesterol diet, and weight control.  You are otherwise up to date with prevention measures today.  Please keep your appointments with your specialists as you may have planned  Please go to the LAB in the Basement (turn left off the elevator) for the tests to be done today - the Hep C, repeat Hemoglobin, PTH level and calcium, and testosterone  You will be contacted by phone if any changes need to be made immediately.  Otherwise, you will receive a letter about your results with an explanation, but please check with MyChart first.  Please remember to sign up for MyChart if you have not done so, as this will be important to you in the future with finding out test results, communicating by private email, and scheduling acute appointments online when needed.  Please return in 1 year for your yearly visit, or sooner if needed, with Lab testing done 3-5 days before

## 2014-12-31 NOTE — Assessment & Plan Note (Signed)

## 2014-12-31 NOTE — Assessment & Plan Note (Signed)
Etiology unclear, diff includes allergies and upper airway resistance syndrome, sleep apnea, low volume, or even polycythemia rubra vera.  For now for repeat CBC, consider ONO and/or hematology if persistent or worsening

## 2014-12-31 NOTE — Assessment & Plan Note (Signed)
stable overall by history and exam, recent data reviewed with pt, and pt to continue medical treatment as before,  to f/u any worsening symptoms or concerns, for f/u testost level today

## 2014-12-31 NOTE — Progress Notes (Signed)
Subjective:    Patient ID: Jared Cantrell, male    DOB: 1950-04-14, 64 y.o.   MRN: [id number]  HPI  Here for wellness and f/u;  Overall doing ok;  Pt denies Chest pain, worsening SOB, DOE, wheezing, orthopnea, PND, worsening LE edema, palpitations, dizziness or syncope.  Pt denies neurological change such as new headache, facial or extremity weakness.  Pt denies polydipsia, polyuria, or low sugar symptoms. Pt states overall good compliance with treatment and medications, good tolerability, and has been trying to follow appropriate diet.  Pt denies worsening depressive symptoms, suicidal ideation or panic. No fever, night sweats, wt loss, loss of appetite, or other constitutional symptoms.  Pt states good ability with ADL's, has low fall risk, home safety reviewed and adequate, no other significant changes in hearing or vision, and only occasionally active with exercise. Now completely retired - former [employer]. Declines flu shot, due for Hep C screen.  No hx of elevated Hgb or calcium in past.  Admits to higher chol in diet/more red meat, and somewhat slowed on overall activity level since retirement Past Medical History  Diagnosis Date  . HYPOGONADISM, MALE 05/12/2008  . HYPERLIPIDEMIA 05/12/2008  . ANXIETY 05/12/2008  . BENIGN PROSTATIC HYPERTROPHY 05/12/2008  . HYPERTENSION 04/04/2010  . 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 Colonoscopy    . Wisdom tooth extraction      reports that he has never smoked. He has never used smokeless tobacco. He reports that he drinks about 1.2 oz of alcohol per week. He reports that he does not use illicit drugs. family history includes Arthritis in his mother; Cancer in his brother; Diabetes in his mother; Hypertension in his brother, mother, and sister; Stroke in his sister. There is no history of Colon cancer, Pancreatic cancer, Rectal cancer, or Stomach cancer. No Known Allergies Current  Outpatient Prescriptions on File Prior to Visit  Medication Sig Dispense Refill  . amitriptyline (ELAVIL) 50 MG tablet Take 1 tablet (50 mg total) by mouth at bedtime. 90 tablet 3  . aspirin 81 MG EC tablet Take 81 mg by mouth daily.      . Testosterone (ANDROGEL) 40.5 MG/2.5GM (1.62%) GEL Place onto the skin.    . sildenafil (VIAGRA) 100 MG tablet Take 1 tablet (100 mg total) by mouth as needed for erectile dysfunction. 10 tablet 5  . Testosterone 20.25 MG/ACT (1.62%) GEL Place 1 application onto the skin daily. 225 g 1   No current facility-administered medications on file prior to visit.    Review of Systems Constitutional: Negative for increased diaphoresis, other activity, appetite or siginficant weight change other than noted HENT: Negative for worsening hearing loss, ear pain, facial swelling, mouth sores and neck stiffness.   Eyes: Negative for other worsening pain, redness or visual disturbance.  Respiratory: Negative for shortness of breath and wheezing  Cardiovascular: Negative for chest pain and palpitations.  Gastrointestinal: Negative for diarrhea, blood in stool, abdominal distention or other pain Genitourinary: Negative for hematuria, flank pain or change in urine volume.  Musculoskeletal: Negative for myalgias or other joint complaints.  Skin: Negative for color change and wound or drainage.  Neurological: Negative for syncope and numbness. other than noted Hematological: Negative for adenopathy. or other swelling Psychiatric/Behavioral: Negative for hallucinations, SI, self-injury, decreased concentration or other worsening agitation.      Objective:   Physical Exam BP 122/82 mmHg  Pulse 84  Temp(Src) 98.2 F (36.8 C) (  Oral)  Ht 5\' 10"  (1.778 m)  Wt 183 lb (83.008 kg)  BMI 26.26 kg/m2  SpO2 97% VS noted,  Constitutional: Pt is oriented to person, place, and time. Appears well-developed and well-nourished, in no significant distress Head: Normocephalic and  atraumatic.  Right Ear: External ear normal.  Left Ear: External ear normal.  Nose: Nose normal.  Mouth/Throat: Oropharynx is clear and moist.  Eyes: Conjunctivae and EOM are normal. Pupils are equal, round, and reactive to light.  Neck: Normal range of motion. Neck supple. No JVD present. No tracheal deviation present or significant neck LA or mass Cardiovascular: Normal rate, regular rhythm, normal heart sounds and intact distal pulses.   Pulmonary/Chest: Effort normal and breath sounds without rales or wheezing  Abdominal: Soft. Bowel sounds are normal. NT. No HSM  Musculoskeletal: Normal range of motion. Exhibits no edema.  Lymphadenopathy:  Has no cervical adenopathy.  Neurological: Pt is alert and oriented to person, place, and time. Pt has normal reflexes. No cranial nerve deficit. Motor grossly intact Skin: Skin is warm and dry. No rash noted.  Psychiatric:  Has normal mood and affect. Behavior is normal.     Assessment & Plan:

## 2015-01-01 LAB — HEPATITIS C ANTIBODY: HCV Ab: NEGATIVE

## 2015-01-03 LAB — PTH, INTACT AND CALCIUM
CALCIUM: 10.5 mg/dL (ref 8.4–10.5)
PTH: 69 pg/mL — ABNORMAL HIGH (ref 14–64)

## 2015-04-15 ENCOUNTER — Encounter: Payer: Self-pay | Admitting: Internal Medicine

## 2015-12-30 ENCOUNTER — Other Ambulatory Visit (INDEPENDENT_AMBULATORY_CARE_PROVIDER_SITE_OTHER): Payer: BLUE CROSS/BLUE SHIELD

## 2015-12-30 DIAGNOSIS — Z0000 Encounter for general adult medical examination without abnormal findings: Secondary | ICD-10-CM | POA: Diagnosis not present

## 2015-12-30 LAB — URINALYSIS, ROUTINE W REFLEX MICROSCOPIC
BILIRUBIN URINE: NEGATIVE
HGB URINE DIPSTICK: NEGATIVE
KETONES UR: NEGATIVE
LEUKOCYTES UA: NEGATIVE
NITRITE: NEGATIVE
Specific Gravity, Urine: 1.01 (ref 1.000–1.030)
TOTAL PROTEIN, URINE-UPE24: NEGATIVE
URINE GLUCOSE: NEGATIVE
Urobilinogen, UA: 0.2 (ref 0.0–1.0)
pH: 7.5 (ref 5.0–8.0)

## 2015-12-30 LAB — LIPID PANEL
CHOL/HDL RATIO: 3
CHOLESTEROL: 174 mg/dL (ref 0–200)
HDL: 62.3 mg/dL (ref >39.00)
LDL CALC: 97 mg/dL (ref 0–99)
NonHDL: 112.08
TRIGLYCERIDES: 74 mg/dL (ref 0.0–149.0)
VLDL: 14.8 mg/dL (ref 0.0–40.0)

## 2015-12-30 LAB — CBC WITH DIFFERENTIAL/PLATELET
BASOS PCT: 0.4 % (ref 0.0–3.0)
Basophils Absolute: 0 10*3/uL (ref 0.0–0.1)
EOS ABS: 0.3 10*3/uL (ref 0.0–0.7)
EOS PCT: 5.5 % — AB (ref 0.0–5.0)
HEMATOCRIT: 48.4 % (ref 39.0–52.0)
HEMOGLOBIN: 15.6 g/dL (ref 13.0–17.0)
LYMPHS PCT: 36.4 % (ref 12.0–46.0)
Lymphs Abs: 1.7 10*3/uL (ref 0.7–4.0)
MCHC: 32.2 g/dL (ref 30.0–36.0)
MCV: 80.1 fl (ref 78.0–100.0)
MONO ABS: 0.4 10*3/uL (ref 0.1–1.0)
Monocytes Relative: 9.4 % (ref 3.0–12.0)
Neutro Abs: 2.3 10*3/uL (ref 1.4–7.7)
Neutrophils Relative %: 48.3 % (ref 43.0–77.0)
Platelets: 166 10*3/uL (ref 150.0–400.0)
RBC: 6.04 Mil/uL — AB (ref 4.22–5.81)
RDW: 15.4 % (ref 11.5–15.5)
WBC: 4.8 10*3/uL (ref 4.0–10.5)

## 2015-12-30 LAB — BASIC METABOLIC PANEL
BUN: 7 mg/dL (ref 6–23)
CO2: 26 mEq/L (ref 19–32)
CREATININE: 1.02 mg/dL (ref 0.40–1.50)
Calcium: 10.7 mg/dL — ABNORMAL HIGH (ref 8.4–10.5)
Chloride: 103 mEq/L (ref 96–112)
GFR: 94.87 mL/min (ref >60.00)
GLUCOSE: 89 mg/dL (ref 70–99)
Potassium: 4.8 mEq/L (ref 3.5–5.1)
Sodium: 139 mEq/L (ref 135–145)

## 2015-12-30 LAB — HEPATIC FUNCTION PANEL
ALBUMIN: 4.4 g/dL (ref 3.5–5.2)
ALT: 30 U/L (ref 0–53)
AST: 24 U/L (ref 0–37)
Alkaline Phosphatase: 75 U/L (ref 39–117)
BILIRUBIN TOTAL: 0.9 mg/dL (ref 0.2–1.2)
Bilirubin, Direct: 0.2 mg/dL (ref 0.0–0.3)
Total Protein: 7.2 g/dL (ref 6.0–8.3)

## 2015-12-30 LAB — TSH: TSH: 0.82 u[IU]/mL (ref 0.35–4.50)

## 2015-12-30 LAB — PSA: PSA: 2.71 ng/mL (ref 0.10–4.00)

## 2016-01-04 ENCOUNTER — Ambulatory Visit (INDEPENDENT_AMBULATORY_CARE_PROVIDER_SITE_OTHER): Payer: BLUE CROSS/BLUE SHIELD | Admitting: Internal Medicine

## 2016-01-04 ENCOUNTER — Encounter: Payer: Self-pay | Admitting: Internal Medicine

## 2016-01-04 VITALS — BP 132/78 | HR 97 | Temp 98.4°F | Resp 20 | Wt 173.0 lb

## 2016-01-04 DIAGNOSIS — E291 Testicular hypofunction: Secondary | ICD-10-CM

## 2016-01-04 DIAGNOSIS — I10 Essential (primary) hypertension: Secondary | ICD-10-CM

## 2016-01-04 DIAGNOSIS — R972 Elevated prostate specific antigen [PSA]: Secondary | ICD-10-CM

## 2016-01-04 DIAGNOSIS — E785 Hyperlipidemia, unspecified: Secondary | ICD-10-CM

## 2016-01-04 DIAGNOSIS — Z0001 Encounter for general adult medical examination with abnormal findings: Secondary | ICD-10-CM | POA: Diagnosis not present

## 2016-01-04 MED ORDER — ATORVASTATIN CALCIUM 40 MG PO TABS: 40.0000 mg | ORAL_TABLET | Freq: Every day | ORAL | 3 refills | Status: DC

## 2016-01-04 MED ORDER — DOXYCYCLINE HYCLATE 100 MG PO TABS: 100.0000 mg | ORAL_TABLET | Freq: Two times a day (BID) | ORAL | 0 refills | Status: DC

## 2016-01-04 MED ORDER — LOSARTAN POTASSIUM 100 MG PO TABS: 100.0000 mg | ORAL_TABLET | Freq: Every day | ORAL | 3 refills | Status: DC

## 2016-01-04 NOTE — Progress Notes (Signed)
Subjective:    Patient ID: Jared Cantrell, male    DOB: 02/07/1951, 65 y.o.   MRN: [id number]  HPI  Here for wellness and f/u;  Overall doing ok;  Pt denies Chest pain, worsening SOB, DOE, wheezing, orthopnea, PND, worsening LE edema, palpitations, dizziness or syncope.  Pt denies neurological change such as new headache, facial or extremity weakness.  Pt denies polydipsia, polyuria, or low sugar symptoms. Pt states overall good compliance with treatment and medications, good tolerability, and has been trying to follow appropriate diet.  Pt denies worsening depressive symptoms, suicidal ideation or panic. No fever, night sweats, wt loss, loss of appetite, or other constitutional symptoms.  Pt states good ability with ADL's, has low fall risk, home safety reviewed and adequate, no other significant changes in hearing or vision, and only occasionally active with exercise. No other new changes to history and overall feels good.  Has some elevated calcium on labs but no muscular or neuro symptoms.  Denies urinary symptoms such as dysuria, frequency, urgency, flank pain, hematuria or n/v, fever, chills. Has been been taking the androgen tx, due for f/u lab soon, also asks for for varicella titer in order to find out if he would benefit from shingles vaccine.  Denies worsening ED symptoms, loss of muscle mass, wt increase or low mood. Has been losing wt intentionally in fact with more activity in his retirement Wt Readings from Last 3 Encounters:  01/04/16 173 lb (78.5 kg)  12/31/14 183 lb (83 kg)  12/01/13 178 lb (80.7 kg)   Past Medical History:  Diagnosis Date  . ANXIETY 05/12/2008  . BENIGN PROSTATIC HYPERTROPHY 05/12/2008  . 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 HYPERLIPIDEMIA 05/12/2008  . HYPERTENSION 04/04/2010  . 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 COLONOSCOPY    . WISDOM TOOTH EXTRACTION      reports that he has never smoked. He has  never used smokeless tobacco. He reports that he drinks about 1.2 oz of alcohol per week . He reports that he does not use drugs. family history includes Arthritis in his mother; Cancer in his brother; Diabetes in his mother; Hypertension in his brother, mother, and sister; Stroke in his sister. No Known Allergies Current Outpatient Prescriptions on File Prior to Visit  Medication Sig Dispense Refill  . amitriptyline (ELAVIL) 50 MG tablet Take 1 tablet (50 mg total) by mouth at bedtime. 90 tablet 3  . aspirin 81 MG EC tablet Take 81 mg by mouth daily.      . Testosterone (ANDROGEL) 40.5 MG/2.5GM (1.62%) GEL Place onto the skin.    . sildenafil (VIAGRA) 100 MG tablet Take 1 tablet (100 mg total) by mouth as needed for erectile dysfunction. 10 tablet 5  . Testosterone 20.25 MG/ACT (1.62%) GEL Place 1 application onto the skin daily. 225 g 1   No current facility-administered medications on file prior to visit.    Review of Systems Constitutional: Negative for increased diaphoresis, or other activity, appetite or siginficant weight change other than noted HENT: Negative for worsening hearing loss, ear pain, facial swelling, mouth sores and neck stiffness.   Eyes: Negative for other worsening pain, redness or visual disturbance.  Respiratory: Negative for choking or stridor Cardiovascular: Negative for other chest pain and palpitations.  Gastrointestinal: Negative for worsening diarrhea, blood in stool, or abdominal distention Genitourinary: Negative for hematuria, flank pain or change in urine volume.  Musculoskeletal: Negative for myalgias or other joint  complaints.  Skin: Negative for other color change and wound or drainage.  Neurological: Negative for syncope and numbness. other than noted Hematological: Negative for adenopathy. or other swelling Psychiatric/Behavioral: Negative for hallucinations, SI, self-injury, decreased concentration or other worsening agitation.  All other system neg  per pt    Objective:   Physical Exam BP 132/78   Pulse 97   Temp 98.4 F (36.9 C) (Oral)   Resp 20   Wt 173 lb (78.5 kg)   SpO2 98%   BMI 24.82 kg/m  VS noted,  Constitutional: Pt is oriented to person, place, and time. Appears well-developed and well-nourished, in no significant distress Head: Normocephalic and atraumatic  Eyes: Conjunctivae and EOM are normal. Pupils are equal, round, and reactive to light Right Ear: External ear normal.  Left Ear: External ear normal Nose: Nose normal.  Mouth/Throat: Oropharynx is clear and moist  Neck: Normal range of motion. Neck supple. No JVD present. No tracheal deviation present or significant neck LA or mass Cardiovascular: Normal rate, regular rhythm, normal heart sounds and intact distal pulses.   Pulmonary/Chest: Effort normal and breath sounds without rales or wheezing  Abdominal: Soft. Bowel sounds are normal. NT. No HSM  Musculoskeletal: Normal range of motion. Exhibits no edema Lymphadenopathy: Has no cervical adenopathy.  Neurological: Pt is alert and oriented to person, place, and time. Pt has normal reflexes. No cranial nerve deficit. Motor grossly intact Skin: Skin is warm and dry. No rash noted or new ulcers Psychiatric:  Has normal mood and affect. Behavior is normal.  No other new exam findings.    Assessment & Plan:

## 2016-01-04 NOTE — Progress Notes (Signed)
Pre visit review using our clinic review tool, if applicable. No additional management support is needed unless otherwise documented below in the visit note. 

## 2016-01-04 NOTE — Patient Instructions (Signed)
Please take all new medication as prescribed - the antibiotic  Please return to the LAB only in 4 weeks for the labs as we discussed  Please continue all other medications as before, and refills have been done if requested.  Please have the pharmacy call with any other refills you may need.  Please continue your efforts at being more active, low cholesterol diet, and weight control.  You are otherwise up to date with prevention measures today.  Please keep your appointments with your specialists as you may have planned - urology in Feb 2018  Please return in 1 year for your yearly visit, or sooner if needed, with Lab testing done 3-5 days before

## 2016-01-05 NOTE — Assessment & Plan Note (Signed)
Stable, but f/u testosterone level with next labs

## 2016-01-05 NOTE — Assessment & Plan Note (Signed)
Very mild but recurrent, ok for PTC and Ca f/u with next labs

## 2016-01-05 NOTE — Assessment & Plan Note (Signed)
Lab Results  Component Value Date   PSA 2.71 12/30/2015   PSA 0.64 12/29/2014   PSA 0.51 01/01/2014   D/w pt, for antibx x 4 wks with f/u PSA, consider urology referral if not improved

## 2016-01-05 NOTE — Assessment & Plan Note (Signed)
stable overall by history and exam, recent data reviewed with pt, and pt to continue medical treatment as before,  to f/u any worsening symptoms or concerns BP Readings from Last 3 Encounters:  01/04/16 132/78  12/31/14 122/82  12/01/13 119/89

## 2016-01-05 NOTE — Assessment & Plan Note (Signed)
stable overall by history and exam, recent data reviewed with pt, and pt to continue medical treatment as before,  to f/u any worsening symptoms or concerns Lab Results  Component Value Date   LDLCALC 97 12/30/2015

## 2016-01-05 NOTE — Assessment & Plan Note (Addendum)
Overall doing well, age appropriate education and counseling updated, referrals for preventative services and immunizations addressed, dietary and smoking counseling addressed, most recent labs reviewed.  I have personally reviewed and have noted:  1) the patient's medical and social history 2) The pt's use of alcohol, tobacco, and illicit drugs 3) The patient's current medications and supplements 4) Functional ability including ADL's, fall risk, home safety risk, hearing and visual impairment 5) Diet and physical activities 6) Evidence for depression or mood disorder 7) The patient's height, weight, and BMI have been recorded in the chart  I have made referrals, and provided counseling and education based on review of the above Also for varicalla titer to see if immune or needs shingles vaccine

## 2016-02-21 ENCOUNTER — Other Ambulatory Visit: Payer: Self-pay | Admitting: Internal Medicine

## 2016-02-28 ENCOUNTER — Encounter: Payer: Self-pay | Admitting: Internal Medicine

## 2016-02-29 ENCOUNTER — Other Ambulatory Visit (INDEPENDENT_AMBULATORY_CARE_PROVIDER_SITE_OTHER): Payer: BLUE CROSS/BLUE SHIELD

## 2016-02-29 ENCOUNTER — Encounter: Payer: Self-pay | Admitting: Internal Medicine

## 2016-02-29 DIAGNOSIS — E291 Testicular hypofunction: Secondary | ICD-10-CM | POA: Diagnosis not present

## 2016-02-29 DIAGNOSIS — R972 Elevated prostate specific antigen [PSA]: Secondary | ICD-10-CM | POA: Diagnosis not present

## 2016-02-29 DIAGNOSIS — Z0001 Encounter for general adult medical examination with abnormal findings: Secondary | ICD-10-CM

## 2016-02-29 LAB — TESTOSTERONE: TESTOSTERONE: 201.44 ng/dL — AB (ref 300.00–890.00)

## 2016-02-29 LAB — PSA: PSA: 0.61 ng/mL (ref 0.10–4.00)

## 2016-03-01 LAB — VARICELLA ZOSTER ANTIBODY, IGG: Varicella IgG: 2340 Index — ABNORMAL HIGH (ref <135.00)

## 2016-03-02 LAB — PTH, INTACT AND CALCIUM

## 2016-03-11 ENCOUNTER — Encounter: Payer: Self-pay | Admitting: Internal Medicine

## 2016-11-05 ENCOUNTER — Ambulatory Visit (INDEPENDENT_AMBULATORY_CARE_PROVIDER_SITE_OTHER): Payer: BLUE CROSS/BLUE SHIELD | Admitting: Family Medicine

## 2016-11-05 ENCOUNTER — Encounter: Payer: Self-pay | Admitting: Family Medicine

## 2016-11-05 VITALS — BP 142/100 | HR 96 | Temp 97.9°F | Ht 70.0 in | Wt 177.6 lb

## 2016-11-05 DIAGNOSIS — M79609 Pain in unspecified limb: Secondary | ICD-10-CM | POA: Diagnosis not present

## 2016-11-05 NOTE — Progress Notes (Signed)
Jared Cantrell - 66 y.o. male MRN [id number]  Date of birth: [birth year]  SUBJECTIVE:  Including CC & ROS.  Chief Complaint  Patient presents with  . Skin Problem    Patient is here today C/O an itching, tingling, and stinging feeling on his skin all over his body which started 2 months ago.  He says that it feels that it is related to electrical equipment/electronic devices.  The last 2 days it has been constant and worsens when he gets closer to those items.    Mr. Jared Cantrell is a 66 year old male that is presenting with tingling and pain sensation in different parts of his body. He reports the symptoms have been ongoing for 2 months. He notices that his symptoms seem to occur when he is on his phone for a period of time. He does not have any skin eruptions but does have these complaints on his extremities in different parts of his body. He feels that the more he is on or around electronics's symptoms seem to get worse. Even when he is around a remote or a computer his symptoms seem to exacerbate. He has looked up on the Internet and reports other people having similar symptoms due to radiation exposure from electronics. He denies any significant stressors in his life. He reports having similar symptoms while he was working and it was related to anxiety. He reports that he is on amitriptyline for frequent urination at night. He denies any fevers or chills.      Review of Systems  Constitutional: Negative for fever.  Skin: Negative for rash.  Neurological: Negative for weakness and numbness.  Hematological: Negative for adenopathy.    HISTORY: Past Medical, Surgical, Social, and Family History Reviewed & Updated per EMR.   Pertinent Historical Findings include:  Past Medical History:  Diagnosis Date  . ANXIETY 05/12/2008  . BENIGN PROSTATIC HYPERTROPHY 05/12/2008  . 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 HYPERLIPIDEMIA 05/12/2008  . HYPERTENSION 04/04/2010  . HYPOGONADISM, MALE  05/12/2008    Past Surgical History:  Procedure Laterality Date  . COLONOSCOPY    . WISDOM TOOTH EXTRACTION      No Known Allergies  Family History  Problem Relation Age of Onset  . Diabetes Mother   . Hypertension Mother   . Arthritis Mother        RA  . Hypertension Sister   . Stroke Sister   . Cancer Brother        throat cancer/smoker  . Hypertension Brother   . Colon cancer Neg Hx   . Pancreatic cancer Neg Hx   . Rectal cancer Neg Hx   . Stomach cancer Neg Hx      Social History   Social History  . Marital status: Married    Spouse name: N/A  . Number of children: 2  . Years of education: N/A   Occupational History  . VP Finance - [employer]    Social History Main Topics  . Smoking status: Never Smoker  . Smokeless tobacco: Never Used  . Alcohol use 1.2 oz/week    2 Glasses of wine per week     Comment: social  . Drug use: No  . Sexual activity: Not on file   Other Topics Concern  . Not on file   Social History Narrative  . No narrative on file     PHYSICAL EXAM:  VS: BP (!) 142/100 (BP Location: Left Arm, Patient Position: Sitting, Cuff Size: Normal)   Pulse  96   Temp 97.9 F (36.6 C) (Oral)   Ht 5\' 10"  (1.778 m)   Wt 177 lb 9.6 oz (80.6 kg)   SpO2 98%   BMI 25.48 kg/m  Physical Exam Gen: NAD, alert, cooperative with exam, ENT: normal lips, normal nasal mucosa,  Eye: normal EOM, normal conjunctiva and lids CV:  no edema, +2 pedal pulses   Resp: no accessory muscle use, non-labored,  Skin: no rashes, no areas of induration  Neuro: normal tone, normal sensation to touch Psych:  normal insight, alert and oriented MSK: Normal gait, normal strength      ASSESSMENT & PLAN:   I spent 40 minutes with this patient, greater than 50% was face-to-face time counseling regarding the below diagnosis.   Pain in extremity He reports that his symptoms are associated with electronic devices. Had a conversation about this possibly being  exacerbation of anxiety and he did not feel that this was the source. He feels that it is closely associated with the radiation given off by electronic devices. Had a discussion that this could be possibly a phobia. Counseled on relaxation techniques and paying attention to his feelings when he is around electronic devices. Provided ways to interpret this behavior and modify his reaction. - If no improvement in 2 weeks' have a follow-up. Could consider adding Cymbalta but is started taking amitriptyline.. Had discussion about potential referral to psychiatry. Unclear if the radiation exposure could be causing his symptoms.

## 2016-11-05 NOTE — Patient Instructions (Signed)
Thank you for coming in,   Please try these different relaxation techniques.   If no improvement in your symptoms then please follow up.    Please feel free to call with any questions or concerns at any time, at [phone]. --Dr. Raeford Razor  1. Breath focus. In this simple, powerful technique, you take long, slow, deep breaths (also known as abdominal or belly breathing). As you breathe, you gently disengage your mind from distracting thoughts and sensations. Breath focus can be especially helpful for people with eating disorders to help them focus on their bodies in a more positive way. However, this technique may not be appropriate for those with health problems that make breathing difficult, such as respiratory ailments or heart failure.  2. Body scan. This technique blends breath focus with progressive muscle relaxation. After a few minutes of deep breathing, you focus on one part of the body or group of muscles at a time and mentally releasing any physical tension you feel there. A body scan can help boost your awareness of the mind-body connection. If you have had a recent surgery that affects your body image or other difficulties with body image, this technique may be less helpful for you.  3. Guided imagery. For this technique, you conjure up soothing scenes, places, or experiences in your mind to help you relax and focus. You can find free apps and online recordings of calming scenes-just make sure to choose imagery you find soothing and that has personal significance. Guided imagery may help you reinforce a positive vision of yourself, but it can be difficult for those who have intrusive thoughts or find it hard to conjure up mental images.  4. Mindfulness meditation. This practice involves sitting comfortably, focusing on your breathing, and bringing your mind's attention to the present moment without drifting into concerns about the past or the future. This form of meditation has enjoyed  increasing popularity in recent years. Research suggests it may be helpful for people with anxiety, depression, and pain.

## 2016-11-05 NOTE — Assessment & Plan Note (Signed)
He reports that his symptoms are associated with electronic devices. Had a conversation about this possibly being exacerbation of anxiety and he did not feel that this was the source. He feels that it is closely associated with the radiation given off by electronic devices. Had a discussion that this could be possibly a phobia. Counseled on relaxation techniques and paying attention to his feelings when he is around electronic devices. Provided ways to interpret this behavior and modify his reaction. - If no improvement in 2 weeks' have a follow-up. Could consider adding Cymbalta but is started taking amitriptyline.. Had discussion about potential referral to psychiatry. Unclear if the radiation exposure could be causing his symptoms.

## 2017-01-02 ENCOUNTER — Encounter: Payer: Self-pay | Admitting: Internal Medicine

## 2017-01-04 ENCOUNTER — Other Ambulatory Visit (INDEPENDENT_AMBULATORY_CARE_PROVIDER_SITE_OTHER): Payer: BLUE CROSS/BLUE SHIELD

## 2017-01-04 DIAGNOSIS — Z0001 Encounter for general adult medical examination with abnormal findings: Secondary | ICD-10-CM | POA: Diagnosis not present

## 2017-01-04 DIAGNOSIS — E291 Testicular hypofunction: Secondary | ICD-10-CM | POA: Diagnosis not present

## 2017-01-04 LAB — CBC WITH DIFFERENTIAL/PLATELET
BASOS ABS: 0 10*3/uL (ref 0.0–0.1)
Basophils Relative: 0.5 % (ref 0.0–3.0)
EOS PCT: 8.2 % — AB (ref 0.0–5.0)
Eosinophils Absolute: 0.3 10*3/uL (ref 0.0–0.7)
HEMATOCRIT: 51.8 % (ref 39.0–52.0)
HEMOGLOBIN: 16.4 g/dL (ref 13.0–17.0)
LYMPHS ABS: 1.3 10*3/uL (ref 0.7–4.0)
LYMPHS PCT: 34 % (ref 12.0–46.0)
MCHC: 31.7 g/dL (ref 30.0–36.0)
MCV: 82.4 fl (ref 78.0–100.0)
MONOS PCT: 13.8 % — AB (ref 3.0–12.0)
Monocytes Absolute: 0.5 10*3/uL (ref 0.1–1.0)
NEUTROS PCT: 43.5 % (ref 43.0–77.0)
Neutro Abs: 1.7 10*3/uL (ref 1.4–7.7)
Platelets: 195 10*3/uL (ref 150.0–400.0)
RBC: 6.28 Mil/uL — AB (ref 4.22–5.81)
RDW: 15.4 % (ref 11.5–15.5)
WBC: 3.9 10*3/uL — AB (ref 4.0–10.5)

## 2017-01-04 LAB — LIPID PANEL
CHOL/HDL RATIO: 3
Cholesterol: 172 mg/dL (ref 0–200)
HDL: 54.7 mg/dL (ref >39.00)
LDL Cholesterol: 103 mg/dL — ABNORMAL HIGH (ref 0–99)
NONHDL: 117.73
TRIGLYCERIDES: 72 mg/dL (ref 0.0–149.0)
VLDL: 14.4 mg/dL (ref 0.0–40.0)

## 2017-01-04 LAB — TSH: TSH: 0.87 u[IU]/mL (ref 0.35–4.50)

## 2017-01-04 LAB — HEPATIC FUNCTION PANEL
ALBUMIN: 4.2 g/dL (ref 3.5–5.2)
ALK PHOS: 61 U/L (ref 39–117)
ALT: 32 U/L (ref 0–53)
AST: 22 U/L (ref 0–37)
Bilirubin, Direct: 0.2 mg/dL (ref 0.0–0.3)
Total Bilirubin: 1.2 mg/dL (ref 0.2–1.2)
Total Protein: 7.1 g/dL (ref 6.0–8.3)

## 2017-01-04 LAB — URINALYSIS, ROUTINE W REFLEX MICROSCOPIC
Bilirubin Urine: NEGATIVE
HGB URINE DIPSTICK: NEGATIVE
Ketones, ur: NEGATIVE
Leukocytes, UA: NEGATIVE
NITRITE: NEGATIVE
RBC / HPF: NONE SEEN (ref 0–?)
SPECIFIC GRAVITY, URINE: 1.015 (ref 1.000–1.030)
Total Protein, Urine: NEGATIVE
Urine Glucose: NEGATIVE
Urobilinogen, UA: 0.2 (ref 0.0–1.0)
WBC UA: NONE SEEN (ref 0–?)
pH: 7.5 (ref 5.0–8.0)

## 2017-01-04 LAB — BASIC METABOLIC PANEL
BUN: 9 mg/dL (ref 6–23)
CALCIUM: 10.5 mg/dL (ref 8.4–10.5)
CO2: 29 meq/L (ref 19–32)
CREATININE: 1.02 mg/dL (ref 0.40–1.50)
Chloride: 103 mEq/L (ref 96–112)
GFR: 94.56 mL/min (ref >60.00)
Glucose, Bld: 100 mg/dL — ABNORMAL HIGH (ref 70–99)
Potassium: 4.3 mEq/L (ref 3.5–5.1)
SODIUM: 139 meq/L (ref 135–145)

## 2017-01-04 LAB — TESTOSTERONE: Testosterone: 1137.89 ng/dL — ABNORMAL HIGH (ref 300.00–890.00)

## 2017-01-08 ENCOUNTER — Encounter: Payer: Self-pay | Admitting: Internal Medicine

## 2017-01-08 ENCOUNTER — Ambulatory Visit (INDEPENDENT_AMBULATORY_CARE_PROVIDER_SITE_OTHER): Payer: BLUE CROSS/BLUE SHIELD | Admitting: Internal Medicine

## 2017-01-08 VITALS — BP 114/82 | HR 83 | Temp 97.8°F | Ht 70.0 in | Wt 172.0 lb

## 2017-01-08 DIAGNOSIS — Z23 Encounter for immunization: Secondary | ICD-10-CM | POA: Diagnosis not present

## 2017-01-08 DIAGNOSIS — Z0000 Encounter for general adult medical examination without abnormal findings: Secondary | ICD-10-CM | POA: Diagnosis not present

## 2017-01-08 DIAGNOSIS — Z114 Encounter for screening for human immunodeficiency virus [HIV]: Secondary | ICD-10-CM | POA: Diagnosis not present

## 2017-01-08 DIAGNOSIS — E291 Testicular hypofunction: Secondary | ICD-10-CM

## 2017-01-08 DIAGNOSIS — F411 Generalized anxiety disorder: Secondary | ICD-10-CM | POA: Diagnosis not present

## 2017-01-08 MED ORDER — LOSARTAN POTASSIUM 100 MG PO TABS: 100.0000 mg | ORAL_TABLET | Freq: Every day | ORAL | 3 refills | Status: DC

## 2017-01-08 MED ORDER — ATORVASTATIN CALCIUM 40 MG PO TABS: 40.0000 mg | ORAL_TABLET | Freq: Every day | ORAL | 3 refills | Status: DC

## 2017-01-08 MED ORDER — HYDROXYZINE HCL 10 MG PO TABS: 10.0000 mg | ORAL_TABLET | Freq: Three times a day (TID) | ORAL | 5 refills | Status: DC | PRN

## 2017-01-08 NOTE — Assessment & Plan Note (Signed)

## 2017-01-08 NOTE — Progress Notes (Signed)
Subjective:    Patient ID: Jared Cantrell, male    DOB: Jun 16, 1950, 66 y.o.   MRN: [id number]  HPI  Here for wellness and f/u;  Overall doing ok;  Pt denies Chest pain, worsening SOB, DOE, wheezing, orthopnea, PND, worsening LE edema, palpitations, dizziness or syncope.  Pt denies neurological change such as new headache, facial or extremity weakness.  Pt denies polydipsia, polyuria, or low sugar symptoms. Pt states overall good compliance with treatment and medications, good tolerability, and has been trying to follow appropriate diet.  Pt denies worsening depressive symptoms, suicidal ideation or panic. No fever, night sweats, wt loss, loss of appetite, or other constitutional symptoms.  Pt states good ability with ADL's, has low fall risk, home safety reviewed and adequate, no other significant changes in hearing or vision, and active with exercise with going to gym 4-5 times per wk.   Sees urology for testosterone replacement.   Wt Readings from Last 3 Encounters:  01/08/17 172 lb (78 kg)  11/05/16 177 lb 9.6 oz (80.6 kg)  01/04/16 173 lb (78.5 kg)  Also states "I am allergic to electronic devices." such as cell phones and similar.  Tends to get "zings" and tingling/itching, wondering if has something to do with his anxiety.  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   Past Medical History:  Diagnosis Date  . ANXIETY 05/12/2008  . BENIGN PROSTATIC HYPERTROPHY 05/12/2008  . 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 HYPERLIPIDEMIA 05/12/2008  . HYPERTENSION 04/04/2010  . 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 COLONOSCOPY    . WISDOM TOOTH EXTRACTION      reports that  has never smoked. he has never used smokeless tobacco. He reports that he drinks about 1.2 oz of alcohol per week. He reports that he does not use drugs. family history includes Arthritis in his mother; Cancer in his  brother; Diabetes in his mother; Hypertension in his brother, mother, and sister; Stroke in his sister. No Known Allergies Current Outpatient Medications on File Prior to Visit  Medication Sig Dispense Refill  . amitriptyline (ELAVIL) 50 MG tablet Take 1 tablet (50 mg total) by mouth at bedtime. 90 tablet 3  . aspirin 81 MG EC tablet Take 81 mg by mouth daily.      . sildenafil (VIAGRA) 100 MG tablet Take 1 tablet (100 mg total) by mouth as needed for erectile dysfunction. 10 tablet 5  . Testosterone 20.25 MG/ACT (1.62%) GEL Place 1 application onto the skin daily. 225 g 1   No current facility-administered medications on file prior to visit.    Review of Systems Constitutional: Negative for other unusual diaphoresis, sweats, appetite or weight changes HENT: Negative for other worsening hearing loss, ear pain, facial swelling, mouth sores or neck stiffness.   Eyes: Negative for other worsening pain, redness or other visual disturbance.  Respiratory: Negative for other stridor or swelling Cardiovascular: Negative for other palpitations or other chest pain  Gastrointestinal: Negative for worsening diarrhea or loose stools, blood in stool, distention or other pain Genitourinary: Negative for hematuria, flank pain or other change in urine volume.  Musculoskeletal: Negative for myalgias or other joint swelling.  Skin: Negative for other color change, or other wound or worsening drainage.  Neurological: Negative for other syncope or numbness. Hematological: Negative for other adenopathy or swelling Psychiatric/Behavioral: Negative for hallucinations, other worsening agitation, SI, self-injury, or new decreased concentration  All other system neg per pt    Objective:   Physical Exam BP 114/82   Pulse 83   Temp 97.8 F (36.6 C) (Oral)   Ht 5\' 10"  (1.778 m)   Wt 172 lb (78 kg)   SpO2 99%   BMI 24.68 kg/m  VS noted,  Constitutional: Pt is oriented to person, place, and time. Appears  well-developed and well-nourished, in no significant distress and comfortable Head: Normocephalic and atraumatic  Eyes: Conjunctivae and EOM are normal. Pupils are equal, round, and reactive to light Right Ear: External ear normal without discharge Left Ear: External ear normal without discharge Nose: Nose without discharge or deformity Mouth/Throat: Oropharynx is without other ulcerations and moist  Neck: Normal range of motion. Neck supple. No JVD present. No tracheal deviation present or significant neck LA or mass Cardiovascular: Normal rate, regular rhythm, normal heart sounds and intact distal pulses.   Pulmonary/Chest: WOB normal and breath sounds without rales or wheezing  Abdominal: Soft. Bowel sounds are normal. NT. No HSM  Musculoskeletal: Normal range of motion. Exhibits no edema Lymphadenopathy: Has no other cervical adenopathy.  Neurological: Pt is alert and oriented to person, place, and time. Pt has normal reflexes. No cranial nerve deficit. Motor grossly intact, Gait intact Skin: Skin is warm and dry. No rash noted or new ulcerations Psychiatric:  Has mild nervous mood and affect. Behavior is normal without agitation No other exam findings    Assessment & Plan:

## 2017-01-08 NOTE — Patient Instructions (Addendum)
Please take all new medication as prescribed - the Atarax as needed  Please continue all other medications as before, and refills have been done if requested.  Please have the pharmacy call with any other refills you may need.  Please continue your efforts at being more active, low cholesterol diet, and weight control.  You are otherwise up to date with prevention measures today.  Please keep your appointments with your specialists as you may have planned  Please return in 1 year for your yearly visit, or sooner if needed, with Lab testing done 3-5 days before

## 2017-01-08 NOTE — Assessment & Plan Note (Signed)
Ok for trial atarax prn,  to f/u any worsening symptoms or concerns

## 2017-03-23 ENCOUNTER — Other Ambulatory Visit: Payer: Self-pay | Admitting: Internal Medicine

## 2017-03-28 ENCOUNTER — Other Ambulatory Visit: Payer: Self-pay | Admitting: Internal Medicine

## 2017-10-17 ENCOUNTER — Ambulatory Visit: Payer: BLUE CROSS/BLUE SHIELD | Admitting: Internal Medicine

## 2017-10-17 ENCOUNTER — Encounter: Payer: Self-pay | Admitting: Internal Medicine

## 2017-10-17 VITALS — BP 122/80 | HR 100 | Temp 98.5°F | Ht 70.0 in | Wt 173.0 lb

## 2017-10-17 DIAGNOSIS — B029 Zoster without complications: Secondary | ICD-10-CM | POA: Insufficient documentation

## 2017-10-17 DIAGNOSIS — I10 Essential (primary) hypertension: Secondary | ICD-10-CM | POA: Diagnosis not present

## 2017-10-17 DIAGNOSIS — F411 Generalized anxiety disorder: Secondary | ICD-10-CM

## 2017-10-17 MED ORDER — ZOSTER VAC RECOMB ADJUVANTED 50 MCG/0.5ML IM SUSR: 0.5000 mL | Freq: Once | INTRAMUSCULAR | 1 refills | Status: AC

## 2017-10-17 MED ORDER — VALACYCLOVIR HCL 1 G PO TABS: 1000.0000 mg | ORAL_TABLET | Freq: Three times a day (TID) | ORAL | 0 refills | Status: DC

## 2017-10-17 NOTE — Patient Instructions (Signed)
Please take all new medication as prescribed - the valtrex antibiotic for the shingles  Your Shingles Vaccine prescription was sent to the pharmacy  Please continue all other medications as before, and refills have been done if requested.  Please have the pharmacy call with any other refills you may need.  Please keep your appointments with your specialists as you may have planned

## 2017-10-17 NOTE — Progress Notes (Signed)
Subjective:    Patient ID: Jared Cantrell, male    DOB: July 26, 1950, 67 y.o.   MRN: [id number]  HPI  Here with acute onset painful rash to right lower lateral chest area x 2 days, without fever, or drainage.  Mod pain, constant, nothing make better or worse.  Pt denies chest pain, increased sob or doe, wheezing, orthopnea, PND, increased LE swelling, palpitations, dizziness or syncope.  Pt denies new neurological symptoms such as new headache, or facial or extremity weakness or numbness   Pt denies polydipsia, polyuria.  .Denies worsening depressive symptoms, suicidal ideation, or panic Past Medical History:  Diagnosis Date  . ANXIETY 05/12/2008  . BENIGN PROSTATIC HYPERTROPHY 05/12/2008  . 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 HYPERLIPIDEMIA 05/12/2008  . HYPERTENSION 04/04/2010  . 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 COLONOSCOPY    . WISDOM TOOTH EXTRACTION      reports that he has never smoked. He has never used smokeless tobacco. He reports that he drinks about 2.0 standard drinks of alcohol per week. He reports that he does not use drugs. family history includes Arthritis in his mother; Cancer in his brother; Diabetes in his mother; Hypertension in his brother, mother, and sister; Stroke in his sister. No Known Allergies Current Outpatient Medications on File Prior to Visit  Medication Sig Dispense Refill  . amitriptyline (ELAVIL) 50 MG tablet Take 1 tablet (50 mg total) by mouth at bedtime. 90 tablet 3  . aspirin 81 MG EC tablet Take 81 mg by mouth daily.      Marland Kitchen atorvastatin (LIPITOR) 40 MG tablet Take 1 tablet (40 mg total) by mouth daily. 90 tablet 3  . atorvastatin (LIPITOR) 40 MG tablet TAKE 1 TABLET(40 MG) BY MOUTH DAILY 90 tablet 3  . hydrOXYzine (ATARAX/VISTARIL) 10 MG tablet Take 1 tablet (10 mg total) by mouth 3 (three) times daily as needed for itching or anxiety. 60 tablet 5  . losartan (COZAAR) 100 MG tablet Take 1  tablet (100 mg total) by mouth daily. 90 tablet 3  . losartan (COZAAR) 100 MG tablet TAKE 1 TABLET(100 MG) BY MOUTH DAILY 90 tablet 2  . sildenafil (VIAGRA) 100 MG tablet Take 1 tablet (100 mg total) by mouth as needed for erectile dysfunction. 10 tablet 5  . Testosterone 20.25 MG/ACT (1.62%) GEL Place 1 application onto the skin daily. 225 g 1   No current facility-administered medications on file prior to visit.    Review of Systems  Constitutional: Negative for other unusual diaphoresis or sweats HENT: Negative for ear discharge or swelling Eyes: Negative for other worsening visual disturbances Respiratory: Negative for stridor or other swelling  Gastrointestinal: Negative for worsening distension or other blood Genitourinary: Negative for retention or other urinary change Musculoskeletal: Negative for other MSK pain or swelling Skin: Negative for color change or other new lesions Neurological: Negative for worsening tremors and other numbness  Psychiatric/Behavioral: Negative for worsening agitation or other fatigue All other system neg per pt    Objective:   Physical Exam BP 122/80   Pulse 100   Temp 98.5 F (36.9 C) (Oral)   Ht 5\' 10"  (1.778 m)   Wt 173 lb (78.5 kg)   SpO2 98%   BMI 24.82 kg/m  VS noted,  Constitutional: Pt appears in NAD HENT: Head: NCAT.  Right Ear: External ear normal.  Left Ear: External ear normal.  Eyes: . Pupils are equal, round, and reactive to light.  Conjunctivae and EOM are normal Nose: without d/c or deformity Neck: Neck supple. Gross normal ROM Cardiovascular: Normal rate and regular rhythm.   Pulmonary/Chest: Effort normal and breath sounds without rales or wheezing.  Abd:  Soft, NT, ND, + BS, no organomegaly Neurological: Pt is alert. At baseline orientation, motor grossly intact Skin: Skin is warm. + rashes right lateral chest about t6 with grouped vesicles on erythem base, no other new lesions, no LE edema Psychiatric: Pt behavior is  normal without agitation  No other exam findings Lab Results  Component Value Date   WBC 3.9 (L) 01/04/2017   HGB 16.4 01/04/2017   HCT 51.8 01/04/2017   PLT 195.0 01/04/2017   GLUCOSE 100 (H) 01/04/2017   CHOL 172 01/04/2017   TRIG 72.0 01/04/2017   HDL 54.70 01/04/2017   LDLCALC 103 (H) 01/04/2017   ALT 32 01/04/2017   AST 22 01/04/2017   NA 139 01/04/2017   K 4.3 01/04/2017   CL 103 01/04/2017   CREATININE 1.02 01/04/2017   BUN 9 01/04/2017   CO2 29 01/04/2017   TSH 0.87 01/04/2017   PSA 0.61 02/29/2016       Assessment & Plan:

## 2017-10-19 NOTE — Assessment & Plan Note (Signed)
Mild to mod, for valtrex asd, pain control, d/w pt natural hx,   to f/u any worsening symptoms or concerns

## 2017-10-19 NOTE — Assessment & Plan Note (Signed)
stable overall by history and exam, and pt to continue medical treatment as before,  to f/u any worsening symptoms or concerns 

## 2017-10-19 NOTE — Assessment & Plan Note (Signed)
stable overall by history and exam, recent data reviewed with pt, and pt to continue medical treatment as before,  to f/u any worsening symptoms or concerns BP Readings from Last 3 Encounters:  10/17/17 122/80  01/08/17 114/82  11/05/16 (!) 142/100

## 2018-01-09 ENCOUNTER — Ambulatory Visit (INDEPENDENT_AMBULATORY_CARE_PROVIDER_SITE_OTHER): Payer: Medicare Other | Admitting: Internal Medicine

## 2018-01-09 ENCOUNTER — Encounter: Payer: Self-pay | Admitting: Internal Medicine

## 2018-01-09 ENCOUNTER — Other Ambulatory Visit (INDEPENDENT_AMBULATORY_CARE_PROVIDER_SITE_OTHER): Payer: Medicare Other

## 2018-01-09 VITALS — BP 116/78 | HR 91 | Temp 97.9°F | Ht 70.0 in | Wt 176.0 lb

## 2018-01-09 DIAGNOSIS — Z114 Encounter for screening for human immunodeficiency virus [HIV]: Secondary | ICD-10-CM

## 2018-01-09 DIAGNOSIS — E291 Testicular hypofunction: Secondary | ICD-10-CM

## 2018-01-09 DIAGNOSIS — N320 Bladder-neck obstruction: Secondary | ICD-10-CM | POA: Diagnosis not present

## 2018-01-09 DIAGNOSIS — E785 Hyperlipidemia, unspecified: Secondary | ICD-10-CM

## 2018-01-09 DIAGNOSIS — I10 Essential (primary) hypertension: Secondary | ICD-10-CM | POA: Diagnosis not present

## 2018-01-09 DIAGNOSIS — Z23 Encounter for immunization: Secondary | ICD-10-CM

## 2018-01-09 LAB — CBC WITH DIFFERENTIAL/PLATELET
BASOS ABS: 0 10*3/uL (ref 0.0–0.1)
BASOS PCT: 0.9 % (ref 0.0–3.0)
EOS ABS: 0.2 10*3/uL (ref 0.0–0.7)
Eosinophils Relative: 5.3 % — ABNORMAL HIGH (ref 0.0–5.0)
HEMATOCRIT: 47.2 % (ref 39.0–52.0)
HEMOGLOBIN: 15.2 g/dL (ref 13.0–17.0)
LYMPHS PCT: 38.6 % (ref 12.0–46.0)
Lymphs Abs: 1.3 10*3/uL (ref 0.7–4.0)
MCHC: 32.2 g/dL (ref 30.0–36.0)
MCV: 81 fl (ref 78.0–100.0)
MONOS PCT: 12.1 % — AB (ref 3.0–12.0)
Monocytes Absolute: 0.4 10*3/uL (ref 0.1–1.0)
Neutro Abs: 1.4 10*3/uL (ref 1.4–7.7)
Neutrophils Relative %: 43.1 % (ref 43.0–77.0)
Platelets: 192 10*3/uL (ref 150.0–400.0)
RBC: 5.83 Mil/uL — ABNORMAL HIGH (ref 4.22–5.81)
RDW: 15.3 % (ref 11.5–15.5)
WBC: 3.3 10*3/uL — AB (ref 4.0–10.5)

## 2018-01-09 LAB — LIPID PANEL
Cholesterol: 178 mg/dL (ref 0–200)
HDL: 55.8 mg/dL (ref >39.00)
LDL Cholesterol: 108 mg/dL — ABNORMAL HIGH (ref 0–99)
NONHDL: 122.69
Total CHOL/HDL Ratio: 3
Triglycerides: 74 mg/dL (ref 0.0–149.0)
VLDL: 14.8 mg/dL (ref 0.0–40.0)

## 2018-01-09 LAB — URINALYSIS, ROUTINE W REFLEX MICROSCOPIC
BILIRUBIN URINE: NEGATIVE
HGB URINE DIPSTICK: NEGATIVE
KETONES UR: NEGATIVE
Leukocytes, UA: NEGATIVE
NITRITE: NEGATIVE
PH: 8 (ref 5.0–8.0)
RBC / HPF: NONE SEEN (ref 0–?)
Specific Gravity, Urine: 1.02 (ref 1.000–1.030)
Total Protein, Urine: NEGATIVE
UROBILINOGEN UA: 0.2 (ref 0.0–1.0)
Urine Glucose: NEGATIVE
WBC UA: NONE SEEN (ref 0–?)

## 2018-01-09 LAB — BASIC METABOLIC PANEL
BUN: 10 mg/dL (ref 6–23)
CALCIUM: 10.4 mg/dL (ref 8.4–10.5)
CHLORIDE: 105 meq/L (ref 96–112)
CO2: 29 meq/L (ref 19–32)
Creatinine, Ser: 0.99 mg/dL (ref 0.40–1.50)
GFR: 97.57 mL/min (ref >60.00)
Glucose, Bld: 92 mg/dL (ref 70–99)
POTASSIUM: 4.7 meq/L (ref 3.5–5.1)
SODIUM: 140 meq/L (ref 135–145)

## 2018-01-09 LAB — HEPATIC FUNCTION PANEL
ALT: 33 U/L (ref 0–53)
AST: 23 U/L (ref 0–37)
Albumin: 4.1 g/dL (ref 3.5–5.2)
Alkaline Phosphatase: 62 U/L (ref 39–117)
BILIRUBIN DIRECT: 0.2 mg/dL (ref 0.0–0.3)
BILIRUBIN TOTAL: 1 mg/dL (ref 0.2–1.2)
TOTAL PROTEIN: 6.7 g/dL (ref 6.0–8.3)

## 2018-01-09 LAB — TESTOSTERONE: Testosterone: 592.63 ng/dL (ref 300.00–890.00)

## 2018-01-09 LAB — PSA: PSA: 1.22 ng/mL (ref 0.10–4.00)

## 2018-01-09 LAB — TSH: TSH: 0.54 u[IU]/mL (ref 0.35–4.50)

## 2018-01-09 MED ORDER — LOSARTAN POTASSIUM 100 MG PO TABS: 100.0000 mg | ORAL_TABLET | Freq: Every day | ORAL | 3 refills | Status: DC

## 2018-01-09 MED ORDER — ZOSTER VAC RECOMB ADJUVANTED 50 MCG/0.5ML IM SUSR: 0.5000 mL | Freq: Once | INTRAMUSCULAR | 1 refills | Status: AC

## 2018-01-09 NOTE — Progress Notes (Signed)
Subjective:    Patient ID: Jared Cantrell, male    DOB: 10-01-1950, 67 y.o.   MRN: [id number]  HPI  Here for yearly f/u;  Overall doing ok;  Pt denies Chest pain, worsening SOB, DOE, wheezing, orthopnea, PND, worsening LE edema, palpitations, dizziness or syncope.  Pt denies neurological change such as new headache, facial or extremity weakness.  Pt denies polydipsia, polyuria, or low sugar symptoms. Pt states overall good compliance with treatment and medications, good tolerability, and has been trying to follow appropriate diet.  Pt denies worsening depressive symptoms, suicidal ideation or panic. No fever, night sweats, wt loss, loss of appetite, or other constitutional symptoms.  Pt states good ability with ADL's, has low fall risk, home safety reviewed and adequate, no other significant changes in hearing or vision, and only occasionally active with exercise.  Retired x 3 yrs from [employer].  No new complaints.  Good compliance with testosterone tx per urology, questions how much it really helps.  Denies urinary symptoms such as dysuria, frequency, urgency, flank pain, hematuria or n/v, fever, chills. Past Medical History:  Diagnosis Date  . ANXIETY 05/12/2008  . BENIGN PROSTATIC HYPERTROPHY 05/12/2008  . 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 HYPERLIPIDEMIA 05/12/2008  . HYPERTENSION 04/04/2010  . 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 COLONOSCOPY    . WISDOM TOOTH EXTRACTION      reports that he has never smoked. He has never used smokeless tobacco. He reports that he drinks about 2.0 standard drinks of alcohol per week. He reports that he does not use drugs. family history includes Arthritis in his mother; Cancer in his brother; Diabetes in his mother; Hypertension in his brother, mother, and sister; Stroke in his sister. No Known Allergies Current Outpatient Medications on File Prior to Visit  Medication Sig Dispense Refill  .  amitriptyline (ELAVIL) 50 MG tablet Take 1 tablet (50 mg total) by mouth at bedtime. 90 tablet 3  . aspirin 81 MG EC tablet Take 81 mg by mouth daily.      Marland Kitchen atorvastatin (LIPITOR) 40 MG tablet Take 1 tablet (40 mg total) by mouth daily. 90 tablet 3  . hydrOXYzine (ATARAX/VISTARIL) 10 MG tablet Take 1 tablet (10 mg total) by mouth 3 (three) times daily as needed for itching or anxiety. 60 tablet 5  . sildenafil (VIAGRA) 100 MG tablet Take 1 tablet (100 mg total) by mouth as needed for erectile dysfunction. 10 tablet 5  . Testosterone 20.25 MG/ACT (1.62%) GEL Place 1 application onto the skin daily. 225 g 1   No current facility-administered medications on file prior to visit.    Review of Systems Constitutional: Negative for other unusual diaphoresis, sweats, appetite or weight changes HENT: Negative for other worsening hearing loss, ear pain, facial swelling, mouth sores or neck stiffness.   Eyes: Negative for other worsening pain, redness or other visual disturbance.  Respiratory: Negative for other stridor or swelling Cardiovascular: Negative for other palpitations or other chest pain  Gastrointestinal: Negative for worsening diarrhea or loose stools, blood in stool, distention or other pain Genitourinary: Negative for hematuria, flank pain or other change in urine volume.  Musculoskeletal: Negative for myalgias or other joint swelling.  Skin: Negative for other color change, or other wound or worsening drainage.  Neurological: Negative for other syncope or numbness. Hematological: Negative for other adenopathy or swelling Psychiatric/Behavioral: Negative for hallucinations, other worsening agitation, SI, self-injury, or new decreased concentration ALl other  system neg per pt    Objective:   Physical Exam BP 116/78   Pulse 91   Temp 97.9 F (36.6 C) (Oral)   Ht 5\' 10"  (1.778 m)   Wt 176 lb (79.8 kg)   SpO2 96%   BMI 25.25 kg/m  VS noted,  Constitutional: Pt is oriented to  person, place, and time. Appears well-developed and well-nourished, in no significant distress and comfortable Head: Normocephalic and atraumatic  Eyes: Conjunctivae and EOM are normal. Pupils are equal, round, and reactive to light Right Ear: External ear normal without discharge Left Ear: External ear normal without discharge Nose: Nose without discharge or deformity Mouth/Throat: Oropharynx is without other ulcerations and moist  Neck: Normal range of motion. Neck supple. No JVD present. No tracheal deviation present or significant neck LA or mass Cardiovascular: Normal rate, regular rhythm, normal heart sounds and intact distal pulses.   Pulmonary/Chest: WOB normal and breath sounds without rales or wheezing  Abdominal: Soft. Bowel sounds are normal. NT. No HSM  Musculoskeletal: Normal range of motion. Exhibits no edema Lymphadenopathy: Has no other cervical adenopathy.  Neurological: Pt is alert and oriented to person, place, and time. Pt has normal reflexes. No cranial nerve deficit. Motor grossly intact, Gait intact Skin: Skin is warm and dry. No rash noted or new ulcerations Psychiatric:  Has normal mood and affect. Behavior is normal without agitation No other exam findings Lab Results  Component Value Date   WBC 3.9 (L) 01/04/2017   HGB 16.4 01/04/2017   HCT 51.8 01/04/2017   PLT 195.0 01/04/2017   GLUCOSE 100 (H) 01/04/2017   CHOL 172 01/04/2017   TRIG 72.0 01/04/2017   HDL 54.70 01/04/2017   LDLCALC 103 (H) 01/04/2017   ALT 32 01/04/2017   AST 22 01/04/2017   NA 139 01/04/2017   K 4.3 01/04/2017   CL 103 01/04/2017   CREATININE 1.02 01/04/2017   BUN 9 01/04/2017   CO2 29 01/04/2017   TSH 0.87 01/04/2017   PSA 0.61 02/29/2016       Assessment & Plan:

## 2018-01-09 NOTE — Patient Instructions (Addendum)
You had the flu shot today  Please make a Nurse Visit appt for 2 weeks for the Prevnar 13 pneumonia shot  Your Shingles shot prescription was sent to the [facility]  Please continue all other medications as before, and refills have been done if requested.  Please have the pharmacy call with any other refills you may need.  Please continue your efforts at being more active, low cholesterol diet, and weight control.  You are otherwise up to date with prevention measures today.  Please keep your appointments with your specialists as you may have planned  Please go to the LAB in the Basement (turn left off the elevator) for the tests to be done today  You will be contacted by phone if any changes need to be made immediately.  Otherwise, you will receive a letter about your results with an explanation, but please check with MyChart first.  Please remember to sign up for MyChart if you have not done so, as this will be important to you in the future with finding out test results, communicating by private email, and scheduling acute appointments online when needed.  Please return in 1 year for your yearly visit, or sooner if needed

## 2018-01-09 NOTE — Assessment & Plan Note (Signed)
stable overall by history and exam, recent data reviewed with pt, and pt to continue medical treatment as before,  to f/u any worsening symptoms or concerns  

## 2018-01-09 NOTE — Assessment & Plan Note (Signed)
For f/u testosterone level,  to f/u any worsening symptoms or concerns

## 2018-01-09 NOTE — Assessment & Plan Note (Signed)
With hx of borderline elevated PTH - for f/u lab

## 2018-01-10 LAB — HIV ANTIBODY (ROUTINE TESTING W REFLEX): HIV 1&2 Ab, 4th Generation: NONREACTIVE

## 2018-01-10 LAB — PTH, INTACT AND CALCIUM
Calcium: 10.6 mg/dL — ABNORMAL HIGH (ref 8.6–10.3)
PTH: 36 pg/mL (ref 14–64)

## 2018-01-23 ENCOUNTER — Ambulatory Visit (INDEPENDENT_AMBULATORY_CARE_PROVIDER_SITE_OTHER): Payer: Medicare Other

## 2018-01-23 DIAGNOSIS — Z23 Encounter for immunization: Secondary | ICD-10-CM

## 2018-01-23 DIAGNOSIS — Z299 Encounter for prophylactic measures, unspecified: Secondary | ICD-10-CM

## 2018-11-21 ENCOUNTER — Encounter: Payer: Self-pay | Admitting: Internal Medicine

## 2018-11-21 ENCOUNTER — Ambulatory Visit (INDEPENDENT_AMBULATORY_CARE_PROVIDER_SITE_OTHER): Payer: Medicare Other | Admitting: Internal Medicine

## 2018-11-21 ENCOUNTER — Telehealth: Payer: Self-pay | Admitting: *Deleted

## 2018-11-21 DIAGNOSIS — J329 Chronic sinusitis, unspecified: Secondary | ICD-10-CM

## 2018-11-21 DIAGNOSIS — Z20822 Contact with and (suspected) exposure to covid-19: Secondary | ICD-10-CM

## 2018-11-21 DIAGNOSIS — F411 Generalized anxiety disorder: Secondary | ICD-10-CM | POA: Diagnosis not present

## 2018-11-21 DIAGNOSIS — Z20828 Contact with and (suspected) exposure to other viral communicable diseases: Secondary | ICD-10-CM | POA: Diagnosis not present

## 2018-11-21 DIAGNOSIS — I10 Essential (primary) hypertension: Secondary | ICD-10-CM

## 2018-11-21 MED ORDER — AZITHROMYCIN 250 MG PO TABS: ORAL_TABLET | ORAL | 0 refills | Status: DC

## 2018-11-21 MED ORDER — ZOSTER VAC RECOMB ADJUVANTED 50 MCG/0.5ML IM SUSR: 0.5000 mL | Freq: Once | INTRAMUSCULAR | 1 refills | Status: AC

## 2018-11-21 MED ORDER — HYDROCODONE-HOMATROPINE 5-1.5 MG/5ML PO SYRP: 5.0000 mL | ORAL_SOLUTION | Freq: Four times a day (QID) | ORAL | 0 refills | Status: AC | PRN

## 2018-11-21 MED ORDER — HYDROCODONE-HOMATROPINE 5-1.5 MG/5ML PO SYRP: 5.0000 mL | ORAL_SOLUTION | Freq: Four times a day (QID) | ORAL | 0 refills | Status: DC | PRN

## 2018-11-21 NOTE — Assessment & Plan Note (Signed)
stable overall by history and exam, recent data reviewed with pt, and pt to continue medical treatment as before,  to f/u any worsening symptoms or concerns  

## 2018-11-21 NOTE — Patient Instructions (Signed)
Please take all new medication as prescribed  Please continue all other medications as before, and refills have been done if requested.  Please have the pharmacy call with any other refills you may need.  Please continue your efforts at being more active, low cholesterol diet, and weight control.  Please keep your appointments with your specialists as you may have planned    

## 2018-11-21 NOTE — Progress Notes (Signed)
Patient ID: Jared Cantrell, male   DOB: 05-13-1950, 68 y.o.   MRN: [id number]  Virtual Visit via Video Note  I connected with Jared Cantrell on 11/21/18 at  2:40 PM EDT by a video enabled telemedicine application and verified that I am speaking with the correct person using two identifiers.  Location: Patient: at home Provider: at office   I discussed the limitations of evaluation and management by telemedicine and the availability of in person appointments. The patient expressed understanding and agreed to proceed.  History of Present Illness:  Here with 2-3 days acute onset fever, facial pain, pressure, headache, general weakness and malaise, and greenish d/c, with mild ST and cough, but pt denies chest pain, wheezing, increased sob or doe, orthopnea, PND, increased LE swelling, palpitations, dizziness or syncope.  Pt denies new neurological symptoms such as new headache, or facial or extremity weakness or numbness   Pt denies polydipsia, polyuria, BP at home less than 14090.  Denies worsening depressive symptoms, suicidal ideation, or panic Past Medical History:  Diagnosis Date  . ANXIETY 05/12/2008  . BENIGN PROSTATIC HYPERTROPHY 05/12/2008  . 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 HYPERLIPIDEMIA 05/12/2008  . HYPERTENSION 04/04/2010  . 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 COLONOSCOPY    . WISDOM TOOTH EXTRACTION      reports that he has never smoked. He has never used smokeless tobacco. He reports current alcohol use of about 2.0 standard drinks of alcohol per week. He reports that he does not use drugs. family history includes Arthritis in his mother; Cancer in his brother; Diabetes in his mother; Hypertension in his brother, mother, and sister; Stroke in his sister. No Known Allergies Current Outpatient Medications on File Prior to Visit  Medication Sig Dispense Refill  . amitriptyline (ELAVIL) 50 MG tablet Take 1 tablet  (50 mg total) by mouth at bedtime. 90 tablet 3  . aspirin 81 MG EC tablet Take 81 mg by mouth daily.      Marland Kitchen atorvastatin (LIPITOR) 40 MG tablet Take 1 tablet (40 mg total) by mouth daily. 90 tablet 3  . hydrOXYzine (ATARAX/VISTARIL) 10 MG tablet Take 1 tablet (10 mg total) by mouth 3 (three) times daily as needed for itching or anxiety. 60 tablet 5  . losartan (COZAAR) 100 MG tablet Take 1 tablet (100 mg total) by mouth daily. 90 tablet 3  . sildenafil (VIAGRA) 100 MG tablet Take 1 tablet (100 mg total) by mouth as needed for erectile dysfunction. 10 tablet 5  . Testosterone 20.25 MG/ACT (1.62%) GEL Place 1 application onto the skin daily. 225 g 1   No current facility-administered medications on file prior to visit.     Observations/Objective: Alert, NAD, appropriate mood and affect, resps normal, cn 2-12 intact, moves all 4s, no visible rash or swelling Lab Results  Component Value Date   WBC 3.3 (L) 01/09/2018   HGB 15.2 01/09/2018   HCT 47.2 01/09/2018   PLT 192.0 01/09/2018   GLUCOSE 92 01/09/2018   CHOL 178 01/09/2018   TRIG 74.0 01/09/2018   HDL 55.80 01/09/2018   LDLCALC 108 (H) 01/09/2018   ALT 33 01/09/2018   AST 23 01/09/2018   NA 140 01/09/2018   K 4.7 01/09/2018   CL 105 01/09/2018   CREATININE 0.99 01/09/2018   BUN 10 01/09/2018   CO2 29 01/09/2018   TSH 0.54 01/09/2018   PSA 1.22 01/09/2018   Assessment and Plan: See notes  Follow Up Instructions: See notes   I discussed the assessment and treatment plan with the patient. The patient was provided an opportunity to ask questions and all were answered. The patient agreed with the plan and demonstrated an understanding of the instructions.   The patient was advised to call back or seek an in-person evaluation if the symptoms worsen or if the condition fails to improve as anticipated.   Cathlean Cower, MD

## 2018-11-21 NOTE — Assessment & Plan Note (Addendum)
Mild to mod, for antibx course,  For covid testing, to f/u any worsening symptoms or concerns

## 2018-11-21 NOTE — Telephone Encounter (Signed)
Patient requesting virtual visit today or Monday. He c/o cough and congestion. He denies SOB, fever, body aches or any other sxs. I called pt- DOXY visit scheduled with Dr. Jenny Reichmann today @ 2:40.

## 2018-11-24 ENCOUNTER — Other Ambulatory Visit: Payer: Self-pay

## 2018-11-24 DIAGNOSIS — Z20822 Contact with and (suspected) exposure to covid-19: Secondary | ICD-10-CM

## 2018-11-26 LAB — NOVEL CORONAVIRUS, NAA: SARS-CoV-2, NAA: NOT DETECTED

## 2018-12-13 ENCOUNTER — Ambulatory Visit: Payer: Medicare Other

## 2018-12-18 ENCOUNTER — Ambulatory Visit: Payer: Medicare Other

## 2018-12-19 ENCOUNTER — Other Ambulatory Visit: Payer: Self-pay

## 2018-12-19 ENCOUNTER — Ambulatory Visit (INDEPENDENT_AMBULATORY_CARE_PROVIDER_SITE_OTHER): Payer: Medicare Other

## 2018-12-19 DIAGNOSIS — Z23 Encounter for immunization: Secondary | ICD-10-CM | POA: Diagnosis not present

## 2019-01-06 ENCOUNTER — Encounter: Payer: Self-pay | Admitting: Internal Medicine

## 2019-01-06 DIAGNOSIS — I10 Essential (primary) hypertension: Secondary | ICD-10-CM

## 2019-01-06 DIAGNOSIS — Z0000 Encounter for general adult medical examination without abnormal findings: Secondary | ICD-10-CM

## 2019-01-09 ENCOUNTER — Other Ambulatory Visit (INDEPENDENT_AMBULATORY_CARE_PROVIDER_SITE_OTHER): Payer: Medicare Other

## 2019-01-09 DIAGNOSIS — I10 Essential (primary) hypertension: Secondary | ICD-10-CM | POA: Diagnosis not present

## 2019-01-09 DIAGNOSIS — Z0000 Encounter for general adult medical examination without abnormal findings: Secondary | ICD-10-CM | POA: Diagnosis not present

## 2019-01-09 LAB — CBC WITH DIFFERENTIAL/PLATELET
Basophils Absolute: 0 10*3/uL (ref 0.0–0.1)
Basophils Relative: 1 % (ref 0.0–3.0)
Eosinophils Absolute: 0.3 10*3/uL (ref 0.0–0.7)
Eosinophils Relative: 7.5 % — ABNORMAL HIGH (ref 0.0–5.0)
HCT: 49.2 % (ref 39.0–52.0)
Hemoglobin: 15.8 g/dL (ref 13.0–17.0)
Lymphocytes Relative: 36.9 % (ref 12.0–46.0)
Lymphs Abs: 1.4 10*3/uL (ref 0.7–4.0)
MCHC: 32.1 g/dL (ref 30.0–36.0)
MCV: 82.5 fl (ref 78.0–100.0)
Monocytes Absolute: 0.5 10*3/uL (ref 0.1–1.0)
Monocytes Relative: 12.7 % — ABNORMAL HIGH (ref 3.0–12.0)
Neutro Abs: 1.6 10*3/uL (ref 1.4–7.7)
Neutrophils Relative %: 41.9 % — ABNORMAL LOW (ref 43.0–77.0)
Platelets: 186 10*3/uL (ref 150.0–400.0)
RBC: 5.97 Mil/uL — ABNORMAL HIGH (ref 4.22–5.81)
RDW: 16.7 % — ABNORMAL HIGH (ref 11.5–15.5)
WBC: 3.9 10*3/uL — ABNORMAL LOW (ref 4.0–10.5)

## 2019-01-09 LAB — URINALYSIS, ROUTINE W REFLEX MICROSCOPIC
Bilirubin Urine: NEGATIVE
Hgb urine dipstick: NEGATIVE
Ketones, ur: NEGATIVE
Leukocytes,Ua: NEGATIVE
Nitrite: NEGATIVE
RBC / HPF: NONE SEEN (ref 0–?)
Specific Gravity, Urine: 1.01 (ref 1.000–1.030)
Total Protein, Urine: NEGATIVE
Urine Glucose: NEGATIVE
Urobilinogen, UA: 0.2 (ref 0.0–1.0)
WBC, UA: NONE SEEN (ref 0–?)
pH: 7 (ref 5.0–8.0)

## 2019-01-09 LAB — HEPATIC FUNCTION PANEL
ALT: 34 U/L (ref 0–53)
AST: 22 U/L (ref 0–37)
Albumin: 4.3 g/dL (ref 3.5–5.2)
Alkaline Phosphatase: 74 U/L (ref 39–117)
Bilirubin, Direct: 0.1 mg/dL (ref 0.0–0.3)
Total Bilirubin: 0.9 mg/dL (ref 0.2–1.2)
Total Protein: 7.1 g/dL (ref 6.0–8.3)

## 2019-01-09 LAB — PSA: PSA: 1.14 ng/mL (ref 0.10–4.00)

## 2019-01-09 LAB — LIPID PANEL
Cholesterol: 187 mg/dL (ref 0–200)
HDL: 58.8 mg/dL (ref >39.00)
LDL Cholesterol: 113 mg/dL — ABNORMAL HIGH (ref 0–99)
NonHDL: 128.19
Total CHOL/HDL Ratio: 3
Triglycerides: 76 mg/dL (ref 0.0–149.0)
VLDL: 15.2 mg/dL (ref 0.0–40.0)

## 2019-01-09 LAB — TSH: TSH: 0.91 u[IU]/mL (ref 0.35–4.50)

## 2019-01-09 LAB — BASIC METABOLIC PANEL
BUN: 8 mg/dL (ref 6–23)
CO2: 29 mEq/L (ref 19–32)
Calcium: 10.4 mg/dL (ref 8.4–10.5)
Chloride: 102 mEq/L (ref 96–112)
Creatinine, Ser: 0.96 mg/dL (ref 0.40–1.50)
GFR: 94.82 mL/min (ref >60.00)
Glucose, Bld: 85 mg/dL (ref 70–99)
Potassium: 4.6 mEq/L (ref 3.5–5.1)
Sodium: 138 mEq/L (ref 135–145)

## 2019-01-13 ENCOUNTER — Encounter: Payer: Self-pay | Admitting: Internal Medicine

## 2019-01-13 ENCOUNTER — Ambulatory Visit (INDEPENDENT_AMBULATORY_CARE_PROVIDER_SITE_OTHER): Payer: Medicare Other | Admitting: Internal Medicine

## 2019-01-13 ENCOUNTER — Other Ambulatory Visit: Payer: Self-pay

## 2019-01-13 VITALS — BP 122/80 | HR 98 | Temp 97.6°F | Ht 70.0 in | Wt 182.0 lb

## 2019-01-13 DIAGNOSIS — E785 Hyperlipidemia, unspecified: Secondary | ICD-10-CM

## 2019-01-13 DIAGNOSIS — I10 Essential (primary) hypertension: Secondary | ICD-10-CM | POA: Diagnosis not present

## 2019-01-13 DIAGNOSIS — Z23 Encounter for immunization: Secondary | ICD-10-CM | POA: Diagnosis not present

## 2019-01-13 DIAGNOSIS — F411 Generalized anxiety disorder: Secondary | ICD-10-CM

## 2019-01-13 DIAGNOSIS — F22 Delusional disorders: Secondary | ICD-10-CM | POA: Diagnosis not present

## 2019-01-13 DIAGNOSIS — R251 Tremor, unspecified: Secondary | ICD-10-CM | POA: Insufficient documentation

## 2019-01-13 MED ORDER — LOSARTAN POTASSIUM 100 MG PO TABS: 100.0000 mg | ORAL_TABLET | Freq: Every day | ORAL | 3 refills | Status: DC

## 2019-01-13 MED ORDER — ATORVASTATIN CALCIUM 40 MG PO TABS: 40.0000 mg | ORAL_TABLET | Freq: Every day | ORAL | 3 refills | Status: DC

## 2019-01-13 NOTE — Patient Instructions (Addendum)
You had the Tdap tetanus shot today  Please continue all other medications as before, and refills have been done if requested.  Please have the pharmacy call with any other refills you may need.  Please continue your efforts at being more active, low cholesterol diet, and weight control.  You are otherwise up to date with prevention measures today.  Please keep your appointments with your specialists as you may have planned  Please return in 6 months, or sooner if needed

## 2019-01-13 NOTE — Progress Notes (Signed)
Subjective:    Patient ID: Jared Cantrell, male    DOB: 1950/03/03, 68 y.o.   MRN: [id number]  HPI   Here for yearly f/u;  Overall doing ok;  Pt denies Chest pain, worsening SOB, DOE, wheezing, orthopnea, PND, worsening LE edema, palpitations, dizziness or syncope.  Pt denies neurological change such as new headache, facial or extremity weakness, but does tend to have a slight tremor in both hand seems to be worse in the AM, maybe mild worse after coffee in the AM, does not really affect oveall function.  Pt denies polydipsia, polyuria, or low sugar symptoms. Pt states overall good compliance with treatment and medications, good tolerability, and has been trying to follow appropriate diet.  Pt denies worsening depressive symptoms, suicidal ideation or panic. No fever, night sweats, wt loss, loss of appetite, or other constitutional symptoms.  Pt states good ability with ADL's, has low fall risk, home safety reviewed and adequate, no other significant changes in hearing or vision, and occasionally active with exercise. Also believes he has tingling to the torso and body with exposure to computers, cell phones, or other electronics, usually within 6 ft per pt, so has to limit himself on electronics use, even the TV remote control, has been going on for at least 42yrs, told per sports medicine may be something like a phobia, and anxiety med last yr did not seem to help. Past Medical History:  Diagnosis Date  . ANXIETY 05/12/2008  . BENIGN PROSTATIC HYPERTROPHY 05/12/2008  . 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 HYPERLIPIDEMIA 05/12/2008  . HYPERTENSION 04/04/2010  . 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 COLONOSCOPY    . WISDOM TOOTH EXTRACTION      reports that he has never smoked. He has never used smokeless tobacco. He reports current alcohol use of about 2.0 standard drinks of alcohol per week. He reports that he does not use drugs. family  history includes Arthritis in his mother; Cancer in his brother; Diabetes in his mother; Hypertension in his brother, mother, and sister; Stroke in his sister. No Known Allergies Current Outpatient Medications on File Prior to Visit  Medication Sig Dispense Refill  . amitriptyline (ELAVIL) 50 MG tablet Take 1 tablet (50 mg total) by mouth at bedtime. 90 tablet 3  . aspirin 81 MG EC tablet Take 81 mg by mouth daily.      Marland Kitchen atorvastatin (LIPITOR) 40 MG tablet Take 1 tablet (40 mg total) by mouth daily. 90 tablet 3  . hydrOXYzine (ATARAX/VISTARIL) 10 MG tablet Take 1 tablet (10 mg total) by mouth 3 (three) times daily as needed for itching or anxiety. 60 tablet 5  . losartan (COZAAR) 100 MG tablet Take 1 tablet (100 mg total) by mouth daily. 90 tablet 3  . sildenafil (VIAGRA) 100 MG tablet Take 1 tablet (100 mg total) by mouth as needed for erectile dysfunction. 10 tablet 5  . Testosterone 20.25 MG/ACT (1.62%) GEL Place 1 application onto the skin daily. 225 g 1   No current facility-administered medications on file prior to visit.    Review of Systems  Constitutional: Negative for other unusual diaphoresis or sweats HENT: Negative for ear discharge or swelling Eyes: Negative for other worsening visual disturbances Respiratory: Negative for stridor or other swelling  Gastrointestinal: Negative for worsening distension or other blood Genitourinary: Negative for retention or other urinary change Musculoskeletal: Negative for other MSK pain or swelling Skin: Negative for color change or  other new lesions Neurological: Negative for worsening tremors and other numbness  Psychiatric/Behavioral: Negative for worsening agitation or other fatigue All otherwise neg per pt     Objective:   Physical Exam BP 122/80   Pulse 98   Temp 97.6 F (36.4 C) (Oral)   Ht 5\' 10"  (1.778 m)   Wt 182 lb (82.6 kg)   SpO2 99%   BMI 26.11 kg/m  VS noted,  Constitutional: Pt appears in NAD HENT: Head: NCAT.   Right Ear: External ear normal.  Left Ear: External ear normal.  Eyes: . Pupils are equal, round, and reactive to light. Conjunctivae and EOM are normal Nose: without d/c or deformity Neck: Neck supple. Gross normal ROM Cardiovascular: Normal rate and regular rhythm.   Pulmonary/Chest: Effort normal and breath sounds without rales or wheezing.  Abd:  Soft, NT, ND, + BS, no organomegaly Neurological: Pt is alert. At baseline orientation, motor grossly intact, trace right hand tremor Skin: Skin is warm. No rashes, other new lesions, no LE edema Psychiatric: Pt behavior is normal without agitation , mild nervous All otherwise neg per pt  Lab Results  Component Value Date   WBC 3.9 (L) 01/09/2019   HGB 15.8 01/09/2019   HCT 49.2 01/09/2019   PLT 186.0 01/09/2019   GLUCOSE 85 01/09/2019   CHOL 187 01/09/2019   TRIG 76.0 01/09/2019   HDL 58.80 01/09/2019   LDLCALC 113 (H) 01/09/2019   ALT 34 01/09/2019   AST 22 01/09/2019   NA 138 01/09/2019   K 4.6 01/09/2019   CL 102 01/09/2019   CREATININE 0.96 01/09/2019   BUN 8 01/09/2019   CO2 29 01/09/2019   TSH 0.91 01/09/2019   PSA 1.14 01/09/2019      Assessment & Plan:

## 2019-01-15 ENCOUNTER — Encounter: Payer: Self-pay | Admitting: Internal Medicine

## 2019-01-17 ENCOUNTER — Encounter: Payer: Self-pay | Admitting: Internal Medicine

## 2019-01-17 NOTE — Assessment & Plan Note (Addendum)
Benign,  to f/u any worsening symptoms or concerns  Note:  Total time for pt hx, exam, review of record with pt in the room, determination of diagnoses and plan for further eval and tx is > 40 min, with over 50% spent in coordination and counseling of patient including the differential dx, tx, further evaluation and other management of delusion, tremor, HLD, HTN, anxiety

## 2019-01-17 NOTE — Assessment & Plan Note (Signed)
stable overall by history and exam, recent data reviewed with pt, and pt to continue medical treatment as before,  to f/u any worsening symptoms or concerns  

## 2019-01-17 NOTE — Assessment & Plan Note (Signed)
Very mild. D/w pt, declines BB trial for now

## 2019-01-19 NOTE — Telephone Encounter (Signed)
Staff to contact pt - due for pneumovax

## 2019-03-25 ENCOUNTER — Other Ambulatory Visit: Payer: Self-pay

## 2019-03-25 ENCOUNTER — Ambulatory Visit (INDEPENDENT_AMBULATORY_CARE_PROVIDER_SITE_OTHER): Payer: Medicare Other | Admitting: *Deleted

## 2019-03-25 DIAGNOSIS — Z23 Encounter for immunization: Secondary | ICD-10-CM | POA: Diagnosis not present

## 2019-04-05 ENCOUNTER — Ambulatory Visit: Payer: Medicare Other | Attending: Internal Medicine

## 2019-04-05 DIAGNOSIS — Z23 Encounter for immunization: Secondary | ICD-10-CM | POA: Insufficient documentation

## 2019-04-05 NOTE — Progress Notes (Signed)
   Covid-19 Vaccination Clinic  Name:  Jared Cantrell    MRN: [id number] DOB: 1950-12-06  04/05/2019  Jared Cantrell was observed post Covid-19 immunization for 15 minutes without incidence. He was provided with Vaccine Information Sheet and instruction to access the V-Safe system.   Jared Cantrell was instructed to call 911 with any severe reactions post vaccine: Marland Kitchen Difficulty breathing  . Swelling of your face and throat  . A fast heartbeat  . A bad rash all over your body  . Dizziness and weakness    Immunizations Administered    Name Date Dose VIS Date Route   Pfizer COVID-19 Vaccine 04/05/2019 10:53 AM 0.3 mL 01/30/2019 Intramuscular   Manufacturer: [place]   Lot: [id number]   [place]: [id number]

## 2019-04-28 ENCOUNTER — Ambulatory Visit: Payer: Medicare Other | Attending: Internal Medicine

## 2019-04-28 DIAGNOSIS — Z23 Encounter for immunization: Secondary | ICD-10-CM | POA: Insufficient documentation

## 2019-04-28 NOTE — Progress Notes (Signed)
   Covid-19 Vaccination Clinic  Name:  Jared Cantrell    MRN: [id number] DOB: 1950-12-25  04/28/2019  [name] was observed post Covid-19 immunization for 15 minutes without incident. He was provided with Vaccine Information Sheet and instruction to access the V-Safe system.   [name] was instructed to call 911 with any severe reactions post vaccine: Marland Kitchen Difficulty breathing  . Swelling of face and throat  . A fast heartbeat  . A bad rash all over body  . Dizziness and weakness   Immunizations Administered    Name Date Dose VIS Date Route   Pfizer COVID-19 Vaccine 04/28/2019 12:52 PM 0.3 mL 01/30/2019 Intramuscular   Manufacturer: [place]   Lot: [id number]   [place]: [id number]

## 2019-07-14 ENCOUNTER — Ambulatory Visit: Payer: Medicare Other | Admitting: Internal Medicine

## 2019-07-16 ENCOUNTER — Other Ambulatory Visit: Payer: Self-pay

## 2019-07-16 ENCOUNTER — Encounter: Payer: Self-pay | Admitting: Internal Medicine

## 2019-07-16 ENCOUNTER — Ambulatory Visit (INDEPENDENT_AMBULATORY_CARE_PROVIDER_SITE_OTHER): Payer: Medicare Other | Admitting: Internal Medicine

## 2019-07-16 VITALS — BP 122/78 | HR 83 | Temp 98.4°F | Ht 70.0 in | Wt 177.0 lb

## 2019-07-16 DIAGNOSIS — E785 Hyperlipidemia, unspecified: Secondary | ICD-10-CM | POA: Diagnosis not present

## 2019-07-16 DIAGNOSIS — I10 Essential (primary) hypertension: Secondary | ICD-10-CM

## 2019-07-16 DIAGNOSIS — F411 Generalized anxiety disorder: Secondary | ICD-10-CM | POA: Diagnosis not present

## 2019-07-16 MED ORDER — ZOSTER VAC RECOMB ADJUVANTED 50 MCG/0.5ML IM SUSR: 0.5000 mL | Freq: Once | INTRAMUSCULAR | 1 refills | Status: AC

## 2019-07-16 NOTE — Patient Instructions (Addendum)
Please consider having the Shingrix shot done at [facility]  Please continue all other medications as before, and refills have been done if requested.  Please have the pharmacy call with any other refills you may need.  Please continue your efforts at being more active, low cholesterol diet, and weight control.  Please keep your appointments with your specialists as you may have planned  Please make an Appointment to return in 6 months, or sooner if needed

## 2019-07-16 NOTE — Assessment & Plan Note (Addendum)

## 2019-07-16 NOTE — Assessment & Plan Note (Signed)
stable overall by history and exam, recent data reviewed with pt, and pt to continue medical treatment as before,  to f/u any worsening symptoms or concerns  

## 2019-07-16 NOTE — Progress Notes (Signed)
Subjective:    Patient ID: Jared Cantrell, male    DOB: [birth year], 69 y.o.   MRN: [id number]  HPI  Here to f/u; overall doing ok,  Pt denies chest pain, increasing sob or doe, wheezing, orthopnea, PND, increased LE swelling, palpitations, dizziness or syncope.  Pt denies new neurological symptoms such as new headache, or facial or extremity weakness or numbness.  Pt denies polydipsia, polyuria, or low sugar episode.  Pt states overall good compliance with meds, mostly trying to follow appropriate diet, with wt overall stable,  but little exercise however.  No new complaints  Denies worsening depressive symptoms, suicidal ideation, or panic Wt Readings from Last 3 Encounters:  07/16/19 177 lb (80.3 kg)  01/13/19 182 lb (82.6 kg)  01/09/18 176 lb (79.8 kg)   Past Medical History:  Diagnosis Date  . ANXIETY 05/12/2008  . BENIGN PROSTATIC HYPERTROPHY 05/12/2008  . 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 HYPERLIPIDEMIA 05/12/2008  . HYPERTENSION 04/04/2010  . 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 COLONOSCOPY    . WISDOM TOOTH EXTRACTION      reports that he has never smoked. He has never used smokeless tobacco. He reports current alcohol use of about 2.0 standard drinks of alcohol per week. He reports that he does not use drugs. family history includes Arthritis in his mother; Cancer in his brother; Diabetes in his mother; Hypertension in his brother, mother, and sister; Stroke in his sister. No Known Allergies Current Outpatient Medications on File Prior to Visit  Medication Sig Dispense Refill  . amitriptyline (ELAVIL) 50 MG tablet Take 1 tablet (50 mg total) by mouth at bedtime. 90 tablet 3  . aspirin 81 MG EC tablet Take 81 mg by mouth daily.      Marland Kitchen atorvastatin (LIPITOR) 40 MG tablet Take 1 tablet (40 mg total) by mouth daily. 90 tablet 3  . hydrOXYzine (ATARAX/VISTARIL) 10 MG tablet Take 1 tablet (10 mg total) by mouth 3 (three)  times daily as needed for itching or anxiety. 60 tablet 5  . losartan (COZAAR) 100 MG tablet Take 1 tablet (100 mg total) by mouth daily. 90 tablet 3  . sildenafil (VIAGRA) 100 MG tablet Take 1 tablet (100 mg total) by mouth as needed for erectile dysfunction. 10 tablet 5  . Testosterone 20.25 MG/ACT (1.62%) GEL Place 1 application onto the skin daily. 225 g 1   No current facility-administered medications on file prior to visit.   Review of Systems All otherwise neg per pt     Objective:   Physical Exam BP 122/78 (BP Location: Left Arm, Patient Position: Sitting, Cuff Size: Large)   Pulse 83   Temp 98.4 F (36.9 C) (Oral)   Ht 5\' 10"  (1.778 m)   Wt 177 lb (80.3 kg)   SpO2 98%   BMI 25.40 kg/m  VS noted,  Constitutional: Pt appears in NAD HENT: Head: NCAT.  Right Ear: External ear normal.  Left Ear: External ear normal.  Eyes: . Pupils are equal, round, and reactive to light. Conjunctivae and EOM are normal Nose: without d/c or deformity Neck: Neck supple. Gross normal ROM Cardiovascular: Normal rate and regular rhythm.   Pulmonary/Chest: Effort normal and breath sounds without rales or wheezing.  Abd:  Soft, NT, ND, + BS, no organomegaly Neurological: Pt is alert. At baseline orientation, motor grossly intact Skin: Skin is warm. No rashes, other new lesions, no LE edema Psychiatric: Pt behavior is normal  without agitation  All otherwise neg per pt Lab Results  Component Value Date   WBC 3.9 (L) 01/09/2019   HGB 15.8 01/09/2019   HCT 49.2 01/09/2019   PLT 186.0 01/09/2019   GLUCOSE 85 01/09/2019   CHOL 187 01/09/2019   TRIG 76.0 01/09/2019   HDL 58.80 01/09/2019   LDLCALC 113 (H) 01/09/2019   ALT 34 01/09/2019   AST 22 01/09/2019   NA 138 01/09/2019   K 4.6 01/09/2019   CL 102 01/09/2019   CREATININE 0.96 01/09/2019   BUN 8 01/09/2019   CO2 29 01/09/2019   TSH 0.91 01/09/2019   PSA 1.14 01/09/2019         Assessment & Plan:

## 2020-01-19 ENCOUNTER — Ambulatory Visit (INDEPENDENT_AMBULATORY_CARE_PROVIDER_SITE_OTHER): Payer: Medicare Other | Admitting: Internal Medicine

## 2020-01-19 ENCOUNTER — Encounter: Payer: Self-pay | Admitting: Internal Medicine

## 2020-01-19 ENCOUNTER — Other Ambulatory Visit: Payer: Self-pay

## 2020-01-19 VITALS — BP 134/90 | HR 92 | Temp 97.7°F | Ht 70.0 in | Wt 180.0 lb

## 2020-01-19 DIAGNOSIS — E785 Hyperlipidemia, unspecified: Secondary | ICD-10-CM

## 2020-01-19 DIAGNOSIS — E559 Vitamin D deficiency, unspecified: Secondary | ICD-10-CM | POA: Diagnosis not present

## 2020-01-19 DIAGNOSIS — N320 Bladder-neck obstruction: Secondary | ICD-10-CM

## 2020-01-19 DIAGNOSIS — N400 Enlarged prostate without lower urinary tract symptoms: Secondary | ICD-10-CM | POA: Diagnosis not present

## 2020-01-19 DIAGNOSIS — E538 Deficiency of other specified B group vitamins: Secondary | ICD-10-CM | POA: Diagnosis not present

## 2020-01-19 DIAGNOSIS — R251 Tremor, unspecified: Secondary | ICD-10-CM | POA: Diagnosis not present

## 2020-01-19 DIAGNOSIS — I10 Essential (primary) hypertension: Secondary | ICD-10-CM

## 2020-01-19 LAB — HEPATIC FUNCTION PANEL
ALT: 32 U/L (ref 0–53)
AST: 22 U/L (ref 0–37)
Albumin: 4.2 g/dL (ref 3.5–5.2)
Alkaline Phosphatase: 69 U/L (ref 39–117)
Bilirubin, Direct: 0.1 mg/dL (ref 0.0–0.3)
Total Bilirubin: 0.7 mg/dL (ref 0.2–1.2)
Total Protein: 7.1 g/dL (ref 6.0–8.3)

## 2020-01-19 LAB — URINALYSIS, ROUTINE W REFLEX MICROSCOPIC
Bilirubin Urine: NEGATIVE
Hgb urine dipstick: NEGATIVE
Ketones, ur: NEGATIVE
Leukocytes,Ua: NEGATIVE
Nitrite: NEGATIVE
RBC / HPF: NONE SEEN (ref 0–?)
Specific Gravity, Urine: 1.02 (ref 1.000–1.030)
Total Protein, Urine: NEGATIVE
Urine Glucose: NEGATIVE
Urobilinogen, UA: 0.2 (ref 0.0–1.0)
WBC, UA: NONE SEEN (ref 0–?)
pH: 7.5 (ref 5.0–8.0)

## 2020-01-19 LAB — BASIC METABOLIC PANEL
BUN: 9 mg/dL (ref 6–23)
CO2: 34 mEq/L — ABNORMAL HIGH (ref 19–32)
Calcium: 10.8 mg/dL — ABNORMAL HIGH (ref 8.4–10.5)
Chloride: 102 mEq/L (ref 96–112)
Creatinine, Ser: 0.92 mg/dL (ref 0.40–1.50)
GFR: 86.36 mL/min (ref >60.00)
Glucose, Bld: 73 mg/dL (ref 70–99)
Potassium: 4.7 mEq/L (ref 3.5–5.1)
Sodium: 137 mEq/L (ref 135–145)

## 2020-01-19 LAB — CBC WITH DIFFERENTIAL/PLATELET
Basophils Absolute: 0 10*3/uL (ref 0.0–0.1)
Basophils Relative: 0.7 % (ref 0.0–3.0)
Eosinophils Absolute: 0.2 10*3/uL (ref 0.0–0.7)
Eosinophils Relative: 5 % (ref 0.0–5.0)
HCT: 48.4 % (ref 39.0–52.0)
Hemoglobin: 15.4 g/dL (ref 13.0–17.0)
Lymphocytes Relative: 34.6 % (ref 12.0–46.0)
Lymphs Abs: 1.5 10*3/uL (ref 0.7–4.0)
MCHC: 31.7 g/dL (ref 30.0–36.0)
MCV: 81.4 fl (ref 78.0–100.0)
Monocytes Absolute: 0.6 10*3/uL (ref 0.1–1.0)
Monocytes Relative: 13.9 % — ABNORMAL HIGH (ref 3.0–12.0)
Neutro Abs: 1.9 10*3/uL (ref 1.4–7.7)
Neutrophils Relative %: 45.8 % (ref 43.0–77.0)
Platelets: 206 10*3/uL (ref 150.0–400.0)
RBC: 5.95 Mil/uL — ABNORMAL HIGH (ref 4.22–5.81)
RDW: 15.7 % — ABNORMAL HIGH (ref 11.5–15.5)
WBC: 4.2 10*3/uL (ref 4.0–10.5)

## 2020-01-19 LAB — LIPID PANEL
Cholesterol: 208 mg/dL — ABNORMAL HIGH (ref 0–200)
HDL: 62 mg/dL (ref >39.00)
LDL Cholesterol: 106 mg/dL — ABNORMAL HIGH (ref 0–99)
NonHDL: 146.2
Total CHOL/HDL Ratio: 3
Triglycerides: 200 mg/dL — ABNORMAL HIGH (ref 0.0–149.0)
VLDL: 40 mg/dL (ref 0.0–40.0)

## 2020-01-19 LAB — VITAMIN D 25 HYDROXY (VIT D DEFICIENCY, FRACTURES): VITD: 25.34 ng/mL — ABNORMAL LOW (ref 30.00–100.00)

## 2020-01-19 LAB — TSH: TSH: 1.09 u[IU]/mL (ref 0.35–4.50)

## 2020-01-19 LAB — PSA: PSA: 1.11 ng/mL (ref 0.10–4.00)

## 2020-01-19 LAB — VITAMIN B12: Vitamin B-12: 377 pg/mL (ref 211–911)

## 2020-01-19 NOTE — Patient Instructions (Signed)

## 2020-01-19 NOTE — Progress Notes (Signed)
Subjective:    Patient ID: Jared Cantrell, male    DOB: 26-Aug-1950, 69 y.o.   MRN: [id number]  HPI  Here to f/u; overall doing ok,  Pt denies chest pain, increasing sob or doe, wheezing, orthopnea, PND, increased LE swelling, palpitations, dizziness or syncope.  Pt denies new neurological symptoms such as new headache, or facial or extremity weakness or numbness.  Pt denies polydipsia, polyuria, or low sugar episode.  Pt states overall good compliance with meds, mostly trying to follow appropriate diet, with wt overall stable, not interested increased statin.  Denies urinary symptoms such as dysuria, frequency, urgency, flank pain, hematuria or n/v, fever, chills. Past Medical History:  Diagnosis Date  . ANXIETY 05/12/2008  . BENIGN PROSTATIC HYPERTROPHY 05/12/2008  . 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 HYPERLIPIDEMIA 05/12/2008  . HYPERTENSION 04/04/2010  . 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 COLONOSCOPY    . WISDOM TOOTH EXTRACTION      reports that he has never smoked. He has never used smokeless tobacco. He reports current alcohol use of about 2.0 standard drinks of alcohol per week. He reports that he does not use drugs. family history includes Arthritis in his mother; Cancer in his brother; Diabetes in his mother; Hypertension in his brother, mother, and sister; Stroke in his sister. No Known Allergies Current Outpatient Medications on File Prior to Visit  Medication Sig Dispense Refill  . amitriptyline (ELAVIL) 50 MG tablet Take 1 tablet (50 mg total) by mouth at bedtime. 90 tablet 3  . aspirin 81 MG EC tablet Take 81 mg by mouth daily.      Marland Kitchen atorvastatin (LIPITOR) 40 MG tablet Take 1 tablet (40 mg total) by mouth daily. 90 tablet 3  . hydrOXYzine (ATARAX/VISTARIL) 10 MG tablet Take 1 tablet (10 mg total) by mouth 3 (three) times daily as needed for itching or anxiety. 60 tablet 5  . losartan (COZAAR) 100 MG tablet Take 1  tablet (100 mg total) by mouth daily. 90 tablet 3  . sildenafil (VIAGRA) 100 MG tablet Take 1 tablet (100 mg total) by mouth as needed for erectile dysfunction. 10 tablet 5  . Testosterone 20.25 MG/ACT (1.62%) GEL Place 1 application onto the skin daily. 225 g 1   No current facility-administered medications on file prior to visit.   Review of Systems All otherwise neg per pt    Objective:   Physical Exam BP 134/90 (BP Location: Left Arm, Patient Position: Sitting, Cuff Size: Large)   Pulse 92   Temp 97.7 F (36.5 C) (Oral)   Ht 5\' 10"  (1.778 m)   Wt 180 lb (81.6 kg)   SpO2 98%   BMI 25.83 kg/m  VS noted,  Constitutional: Pt appears in NAD HENT: Head: NCAT.  Right Ear: External ear normal.  Left Ear: External ear normal.  Eyes: . Pupils are equal, round, and reactive to light. Conjunctivae and EOM are normal Nose: without d/c or deformity Neck: Neck supple. Gross normal ROM Cardiovascular: Normal rate and regular rhythm.   Pulmonary/Chest: Effort normal and breath sounds without rales or wheezing.  Abd:  Soft, NT, ND, + BS, no organomegaly Neurological: Pt is alert. At baseline orientation, motor grossly intact Skin: Skin is warm. No rashes, other new lesions, no LE edema Psychiatric: Pt behavior is normal without agitation  All otherwise neg per pt Lab Results  Component Value Date   WBC 4.2 01/19/2020   HGB 15.4 01/19/2020  HCT 48.4 01/19/2020   PLT 206.0 01/19/2020   GLUCOSE 73 01/19/2020   CHOL 208 (H) 01/19/2020   TRIG 200.0 (H) 01/19/2020   HDL 62.00 01/19/2020   LDLCALC 106 (H) 01/19/2020   ALT 32 01/19/2020   AST 22 01/19/2020   NA 137 01/19/2020   K 4.7 01/19/2020   CL 102 01/19/2020   CREATININE 0.92 01/19/2020   BUN 9 01/19/2020   CO2 34 (H) 01/19/2020   TSH 1.09 01/19/2020   PSA 1.11 01/19/2020      Assessment & Plan:

## 2020-01-19 NOTE — Assessment & Plan Note (Signed)
Mild right > left hands, no change, stable, cont to follow

## 2020-01-24 ENCOUNTER — Encounter: Payer: Self-pay | Admitting: Internal Medicine

## 2020-01-24 NOTE — Assessment & Plan Note (Signed)
stable overall by history and exam, recent data reviewed with pt, and pt to continue medical treatment as before,  to f/u any worsening symptoms or concerns  

## 2020-01-24 NOTE — Assessment & Plan Note (Addendum)
Mild uncontrolled, declines increased statin, o/w stable overall by history and exam, recent data reviewed with pt, and pt to continue medical treatment as before,  to f/u any worsening symptoms or concerns  I spent 31 minutes in preparing to see the patient by review of recent labs, imaging and procedures, obtaining and reviewing separately obtained history, communicating with the patient and family or caregiver, ordering medications, tests or procedures, and documenting clinical information in the EHR including the differential Dx, treatment, and any further evaluation and other management of hld, htn, bph, tremor

## 2020-01-26 ENCOUNTER — Other Ambulatory Visit: Payer: Self-pay | Admitting: Internal Medicine

## 2020-04-05 ENCOUNTER — Other Ambulatory Visit: Payer: Self-pay | Admitting: Internal Medicine

## 2020-04-05 NOTE — Telephone Encounter (Signed)
Please refill as per office routine med refill policy (all routine meds refilled for 3 mo or monthly per pt preference up to one year from last visit, then month to month grace period for 3 mo, then further med refills will have to be denied)  

## 2020-07-13 ENCOUNTER — Ambulatory Visit: Payer: Medicare Other | Admitting: Internal Medicine

## 2020-07-20 ENCOUNTER — Ambulatory Visit (INDEPENDENT_AMBULATORY_CARE_PROVIDER_SITE_OTHER): Payer: Medicare Other | Admitting: Internal Medicine

## 2020-07-20 ENCOUNTER — Other Ambulatory Visit: Payer: Self-pay

## 2020-07-20 ENCOUNTER — Encounter: Payer: Self-pay | Admitting: Internal Medicine

## 2020-07-20 VITALS — BP 124/78 | HR 74 | Temp 98.0°F | Ht 70.0 in | Wt 174.0 lb

## 2020-07-20 DIAGNOSIS — E7800 Pure hypercholesterolemia, unspecified: Secondary | ICD-10-CM | POA: Diagnosis not present

## 2020-07-20 DIAGNOSIS — I10 Essential (primary) hypertension: Secondary | ICD-10-CM

## 2020-07-20 DIAGNOSIS — E291 Testicular hypofunction: Secondary | ICD-10-CM | POA: Diagnosis not present

## 2020-07-20 DIAGNOSIS — E559 Vitamin D deficiency, unspecified: Secondary | ICD-10-CM | POA: Insufficient documentation

## 2020-07-20 LAB — LIPID PANEL
Cholesterol: 168 mg/dL (ref 0–200)
HDL: 58.5 mg/dL (ref >39.00)
LDL Cholesterol: 95 mg/dL (ref 0–99)
NonHDL: 109.88
Total CHOL/HDL Ratio: 3
Triglycerides: 75 mg/dL (ref 0.0–149.0)
VLDL: 15 mg/dL (ref 0.0–40.0)

## 2020-07-20 LAB — CBC WITH DIFFERENTIAL/PLATELET
Basophils Absolute: 0 10*3/uL (ref 0.0–0.1)
Basophils Relative: 1.1 % (ref 0.0–3.0)
Eosinophils Absolute: 0.2 10*3/uL (ref 0.0–0.7)
Eosinophils Relative: 6.1 % — ABNORMAL HIGH (ref 0.0–5.0)
HCT: 44.9 % (ref 39.0–52.0)
Hemoglobin: 14.4 g/dL (ref 13.0–17.0)
Lymphocytes Relative: 39.6 % (ref 12.0–46.0)
Lymphs Abs: 1.4 10*3/uL (ref 0.7–4.0)
MCHC: 32.1 g/dL (ref 30.0–36.0)
MCV: 80.9 fl (ref 78.0–100.0)
Monocytes Absolute: 0.4 10*3/uL (ref 0.1–1.0)
Monocytes Relative: 12.7 % — ABNORMAL HIGH (ref 3.0–12.0)
Neutro Abs: 1.4 10*3/uL (ref 1.4–7.7)
Neutrophils Relative %: 40.5 % — ABNORMAL LOW (ref 43.0–77.0)
Platelets: 196 10*3/uL (ref 150.0–400.0)
RBC: 5.54 Mil/uL (ref 4.22–5.81)
RDW: 15.3 % (ref 11.5–15.5)
WBC: 3.5 10*3/uL — ABNORMAL LOW (ref 4.0–10.5)

## 2020-07-20 LAB — HEPATIC FUNCTION PANEL
ALT: 33 U/L (ref 0–53)
AST: 27 U/L (ref 0–37)
Albumin: 4.2 g/dL (ref 3.5–5.2)
Alkaline Phosphatase: 61 U/L (ref 39–117)
Bilirubin, Direct: 0.1 mg/dL (ref 0.0–0.3)
Total Bilirubin: 0.7 mg/dL (ref 0.2–1.2)
Total Protein: 7 g/dL (ref 6.0–8.3)

## 2020-07-20 LAB — TESTOSTERONE: Testosterone: 285.68 ng/dL — ABNORMAL LOW (ref 300.00–890.00)

## 2020-07-20 LAB — VITAMIN D 25 HYDROXY (VIT D DEFICIENCY, FRACTURES): VITD: 29.96 ng/mL — ABNORMAL LOW (ref 30.00–100.00)

## 2020-07-20 NOTE — Patient Instructions (Signed)

## 2020-07-20 NOTE — Progress Notes (Signed)
Patient ID: Jared Cantrell, male   DOB: 1950-10-22, 70 y.o.   MRN: [id number]        Chief Complaint: f/u low vit d, hld, low testosterone and mild hypercalcemia       HPI:  Jared Cantrell is a 70 y.o. male here overall doing ok, trying to follow lower chol diet, taking Vit D, asks for low testosterone check, and recent slight mild elevated calcium Pt denies chest pain, increased sob or doe, wheezing, orthopnea, PND, increased LE swelling, palpitations, dizziness or syncope.   Pt denies polydipsia, polyuria, or new focal neuro s/s.   Pt denies fever, wt loss, night sweats, loss of appetite, or other constitutional symptoms  No other new complaints       Wt Readings from Last 3 Encounters:  07/20/20 174 lb (78.9 kg)  01/19/20 180 lb (81.6 kg)  07/16/19 177 lb (80.3 kg)   BP Readings from Last 3 Encounters:  07/20/20 124/78  01/19/20 134/90  07/16/19 122/78         Past Medical History:  Diagnosis Date  . ANXIETY 05/12/2008  . BENIGN PROSTATIC HYPERTROPHY 05/12/2008  . 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 HYPERLIPIDEMIA 05/12/2008  . HYPERTENSION 04/04/2010  . 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 COLONOSCOPY    . WISDOM TOOTH EXTRACTION      reports that he has never smoked. He has never used smokeless tobacco. He reports current alcohol use of about 2.0 standard drinks of alcohol per week. He reports that he does not use drugs. family history includes Arthritis in his mother; Cancer in his brother; Diabetes in his mother; Hypertension in his brother, mother, and sister; Stroke in his sister. No Known Allergies Current Outpatient Medications on File Prior to Visit  Medication Sig Dispense Refill  . amitriptyline (ELAVIL) 50 MG tablet Take 1 tablet (50 mg total) by mouth at bedtime. 90 tablet 3  . aspirin 81 MG EC tablet Take 81 mg by mouth daily.    Marland Kitchen atorvastatin (LIPITOR) 40 MG tablet TAKE 1 TABLET DAILY 90 tablet 3  .  hydrOXYzine (ATARAX/VISTARIL) 10 MG tablet Take 1 tablet (10 mg total) by mouth 3 (three) times daily as needed for itching or anxiety. 60 tablet 5  . losartan (COZAAR) 100 MG tablet TAKE 1 TABLET DAILY 90 tablet 3  . sildenafil (VIAGRA) 100 MG tablet Take 1 tablet (100 mg total) by mouth as needed for erectile dysfunction. 10 tablet 5  . Testosterone 20.25 MG/ACT (1.62%) GEL Place 1 application onto the skin daily. 225 g 1   No current facility-administered medications on file prior to visit.        ROS:  All others reviewed and negative.  Objective        PE:  BP 124/78 (BP Location: Right Arm, Patient Position: Sitting, Cuff Size: Large)   Pulse 74   Temp 98 F (36.7 C) (Oral)   Ht 5\' 10"  (1.778 m)   Wt 174 lb (78.9 kg)   SpO2 99%   BMI 24.97 kg/m                 Constitutional: Pt appears in NAD               HENT: Head: NCAT.                Right Ear: External ear normal.  Left Ear: External ear normal.                Eyes: . Pupils are equal, round, and reactive to light. Conjunctivae and EOM are normal               Nose: without d/c or deformity               Neck: Neck supple. Gross normal ROM               Cardiovascular: Normal rate and regular rhythm.                 Pulmonary/Chest: Effort normal and breath sounds without rales or wheezing.                Abd:  Soft, NT, ND, + BS, no organomegaly               Neurological: Pt is alert. At baseline orientation, motor grossly intact               Skin: Skin is warm. No rashes, no other new lesions, LE edema - none               Psychiatric: Pt behavior is normal without agitation   Micro: none  Cardiac tracings I have personally interpreted today:  none  Pertinent Radiological findings (summarize): none   Lab Results  Component Value Date   WBC 3.5 (L) 07/20/2020   HGB 14.4 07/20/2020   HCT 44.9 07/20/2020   PLT 196.0 07/20/2020   GLUCOSE 73 01/19/2020   CHOL 168 07/20/2020   TRIG 75.0  07/20/2020   HDL 58.50 07/20/2020   LDLCALC 95 07/20/2020   ALT 33 07/20/2020   AST 27 07/20/2020   NA 137 01/19/2020   K 4.7 01/19/2020   CL 102 01/19/2020   CREATININE 0.92 01/19/2020   BUN 9 01/19/2020   CO2 34 (H) 01/19/2020   TSH 1.09 01/19/2020   PSA 1.11 01/19/2020   Assessment/Plan:  Jared Cantrell is a 71 y.o. Black or African American [2] male with  has a past medical history of ANXIETY (05/12/2008), BENIGN PROSTATIC HYPERTROPHY (05/12/2008), ERECTILE DYSFUNCTION, ORGANIC (04/04/2010), HYPERLIPIDEMIA (05/12/2008), HYPERTENSION (04/04/2010), and HYPOGONADISM, MALE (05/12/2008).  Hypogonadism in male For testosterone level,  to f/u any worsening symptoms or concerns  Hyperlipidemia Lab Results  Component Value Date   [place] 95 07/20/2020   Stable, pt to continue current statin lipitor; but also for cardiac ct score and if abnormal goal ldl would be < 70, and consider increased lipitor   Essential hypertension BP Readings from Last 3 Encounters:  07/20/20 124/78  01/19/20 134/90  07/16/19 122/78   Stable, pt to continue medical treatment losartan 100   Hypercalcemia Asympt, also for f/u lab and PTH  Vitamin D deficiency Last vitamin D Lab Results  Component Value Date   VD25OH 29.96 (L) 07/20/2020   Low,, to start oral replacement    Followup: Return in about 6 months (around 01/19/2021).  Cathlean Cower, MD 07/23/2020 11:05 PM [facility]

## 2020-07-21 ENCOUNTER — Encounter: Payer: Self-pay | Admitting: Internal Medicine

## 2020-07-22 LAB — PTH, INTACT AND CALCIUM
Calcium: 10.4 mg/dL — ABNORMAL HIGH (ref 8.6–10.3)
PTH: 74 pg/mL (ref 16–77)

## 2020-07-23 ENCOUNTER — Encounter: Payer: Self-pay | Admitting: Internal Medicine

## 2020-07-23 NOTE — Assessment & Plan Note (Addendum)
Lab Results  Component Value Date   [place] 07/20/2020   Stable, pt to continue current statin lipitor; but also for cardiac ct score and if abnormal goal ldl would be < 70, and consider increased lipitor

## 2020-07-23 NOTE — Assessment & Plan Note (Signed)
BP Readings from Last 3 Encounters:  07/20/20 124/78  01/19/20 134/90  07/16/19 122/78   Stable, pt to continue medical treatment losartan 100

## 2020-07-23 NOTE — Assessment & Plan Note (Signed)
For testosterone level,  to f/u any worsening symptoms or concerns

## 2020-07-23 NOTE — Assessment & Plan Note (Signed)
Asympt, also for f/u lab and PTH

## 2020-07-23 NOTE — Assessment & Plan Note (Signed)
Last vitamin D Lab Results  Component Value Date   VD25OH 29.96 (L) 07/20/2020   Low,, to start oral replacement

## 2020-11-09 ENCOUNTER — Ambulatory Visit: Payer: Medicare Other

## 2020-11-21 ENCOUNTER — Ambulatory Visit (INDEPENDENT_AMBULATORY_CARE_PROVIDER_SITE_OTHER): Payer: Medicare Other

## 2020-11-21 VITALS — Ht 70.0 in | Wt 175.0 lb

## 2020-11-21 DIAGNOSIS — Z0000 Encounter for general adult medical examination without abnormal findings: Secondary | ICD-10-CM

## 2020-11-21 NOTE — Patient Instructions (Signed)
Jared Cantrell , Thank you for taking time to come for your Medicare Wellness Visit. I appreciate your ongoing commitment to your health goals. Please review the following plan we discussed and let me know if I can assist you in the future.   Screening recommendations/referrals: Colonoscopy: 12/01/2013; due every 10 years (due 12/02/2023) Recommended yearly ophthalmology/optometry visit for glaucoma screening and checkup Recommended yearly dental visit for hygiene and checkup  Vaccinations: Influenza vaccine: 12/14/2019; due Fall 2022 Pneumococcal vaccine: 01/23/2018, 03/25/2019 Tdap vaccine: 01/13/2019; due every 10 years (due 01/12/2029) Shingles vaccine: 07/21/2019, 09/21/2019   Covid-19:04/05/2019, 05/08/2019, 12/14/2019, 07/06/2020  Advanced directives: Please bring a copy of your health care power of attorney and living will to the office at your convenience.  Conditions/risks identified: Yes; Client understands the importance of follow-up with providers by attending scheduled visits and discussed goals to eat healthier, increase physical activity, exercise the brain, socialize more, get enough sleep and make time for laughter.   Next appointment: Please schedule your next Medicare Wellness Visit with your Nurse Health Advisor in 1 year by calling [phone].  Preventive Care 22 Years and Older, Male Preventive care refers to lifestyle choices and visits with your health care provider that can promote health and wellness. What does preventive care include? A yearly physical exam. This is also called an annual well check. Dental exams once or twice a year. Routine eye exams. Ask your health care provider how often you should have your eyes checked. Personal lifestyle choices, including: Daily care of your teeth and gums. Regular physical activity. Eating a healthy diet. Avoiding tobacco and drug use. Limiting alcohol use. Practicing safe sex. Taking low doses of aspirin every  day. Taking vitamin and mineral supplements as recommended by your health care provider. What happens during an annual well check? The services and screenings done by your health care provider during your annual well check will depend on your age, overall health, lifestyle risk factors, and family history of disease. Counseling  Your health care provider may ask you questions about your: Alcohol use. Tobacco use. Drug use. Emotional well-being. Home and relationship well-being. Sexual activity. Eating habits. History of falls. Memory and ability to understand (cognition). Work and work Statistician. Screening  You may have the following tests or measurements: Height, weight, and BMI. Blood pressure. Lipid and cholesterol levels. These may be checked every 5 years, or more frequently if you are over 53 years old. Skin check. Lung cancer screening. You may have this screening every year starting at age 74 if you have a 30-pack-year history of smoking and currently smoke or have quit within the past 15 years. Fecal occult blood test (FOBT) of the stool. You may have this test every year starting at age 51. Flexible sigmoidoscopy or colonoscopy. You may have a sigmoidoscopy every 5 years or a colonoscopy every 10 years starting at age 22. Prostate cancer screening. Recommendations will vary depending on your family history and other risks. Hepatitis C blood test. Hepatitis B blood test. Sexually transmitted disease (STD) testing. Diabetes screening. This is done by checking your blood sugar (glucose) after you have not eaten for a while (fasting). You may have this done every 1-3 years. Abdominal aortic aneurysm (AAA) screening. You may need this if you are a current or former smoker. Osteoporosis. You may be screened starting at age 32 if you are at high risk. Talk with your health care provider about your test results, treatment options, and if necessary, the need for more  tests. Vaccines  Your health care provider may recommend certain vaccines, such as: Influenza vaccine. This is recommended every year. Tetanus, diphtheria, and acellular pertussis (Tdap, Td) vaccine. You may need a Td booster every 10 years. Zoster vaccine. You may need this after age 46. Pneumococcal 13-valent conjugate (PCV13) vaccine. One dose is recommended after age 78. Pneumococcal polysaccharide (PPSV23) vaccine. One dose is recommended after age 14. Talk to your health care provider about which screenings and vaccines you need and how often you need them. This information is not intended to replace advice given to you by your health care provider. Make sure you discuss any questions you have with your health care provider. Document Released: 03/04/2015 Document Revised: 10/26/2015 Document Reviewed: 12/07/2014 Elsevier Interactive Patient Education  2017 [place] Prevention in the Home Falls can cause injuries. They can happen to people of all ages. There are many things you can do to make your home safe and to help prevent falls. What can I do on the outside of my home? Regularly fix the edges of walkways and driveways and fix any cracks. Remove anything that might make you trip as you walk through a door, such as a raised step or threshold. Trim any bushes or trees on the path to your home. Use bright outdoor lighting. Clear any walking paths of anything that might make someone trip, such as rocks or tools. Regularly check to see if handrails are loose or broken. Make sure that both sides of any steps have handrails. Any raised decks and porches should have guardrails on the edges. Have any leaves, snow, or ice cleared regularly. Use sand or salt on walking paths during winter. Clean up any spills in your garage right away. This includes oil or grease spills. What can I do in the bathroom? Use night lights. Install grab bars by the toilet and in the tub and shower.  Do not use towel bars as grab bars. Use non-skid mats or decals in the tub or shower. If you need to sit down in the shower, use a plastic, non-slip stool. Keep the floor dry. Clean up any water that spills on the floor as soon as it happens. Remove soap buildup in the tub or shower regularly. Attach bath mats securely with double-sided non-slip rug tape. Do not have throw rugs and other things on the floor that can make you trip. What can I do in the bedroom? Use night lights. Make sure that you have a light by your bed that is easy to reach. Do not use any sheets or blankets that are too big for your bed. They should not hang down onto the floor. Have a firm chair that has side arms. You can use this for support while you get dressed. Do not have throw rugs and other things on the floor that can make you trip. What can I do in the kitchen? Clean up any spills right away. Avoid walking on wet floors. Keep items that you use a lot in easy-to-reach places. If you need to reach something above you, use a strong step stool that has a grab bar. Keep electrical cords out of the way. Do not use floor polish or wax that makes floors slippery. If you must use wax, use non-skid floor wax. Do not have throw rugs and other things on the floor that can make you trip. What can I do with my stairs? Do not leave any items on the stairs. Make sure that  there are handrails on both sides of the stairs and use them. Fix handrails that are broken or loose. Make sure that handrails are as long as the stairways. Check any carpeting to make sure that it is firmly attached to the stairs. Fix any carpet that is loose or worn. Avoid having throw rugs at the top or bottom of the stairs. If you do have throw rugs, attach them to the floor with carpet tape. Make sure that you have a light switch at the top of the stairs and the bottom of the stairs. If you do not have them, ask someone to add them for you. What else  can I do to help prevent falls? Wear shoes that: Do not have high heels. Have rubber bottoms. Are comfortable and fit you well. Are closed at the toe. Do not wear sandals. If you use a stepladder: Make sure that it is fully opened. Do not climb a closed stepladder. Make sure that both sides of the stepladder are locked into place. Ask someone to hold it for you, if possible. Clearly mark and make sure that you can see: Any grab bars or handrails. First and last steps. Where the edge of each step is. Use tools that help you move around (mobility aids) if they are needed. These include: Canes. Walkers. Scooters. Crutches. Turn on the lights when you go into a dark area. Replace any light bulbs as soon as they burn out. Set up your furniture so you have a clear path. Avoid moving your furniture around. If any of your floors are uneven, fix them. If there are any pets around you, be aware of where they are. Review your medicines with your doctor. Some medicines can make you feel dizzy. This can increase your chance of falling. Ask your doctor what other things that you can do to help prevent falls. This information is not intended to replace advice given to you by your health care provider. Make sure you discuss any questions you have with your health care provider. Document Released: 12/02/2008 Document Revised: 07/14/2015 Document Reviewed: 03/12/2014 Elsevier Interactive Patient Education  2017 [employer].

## 2020-11-21 NOTE — Progress Notes (Addendum)
I connected with Jared Cantrell today by telephone and verified that I am speaking with the correct person using two identifiers. Location patient: home Location provider: work Persons participating in the virtual visit: patient, provider.   I discussed the limitations, risks, security and privacy concerns of performing an evaluation and management service by telephone and the availability of in person appointments. I also discussed with the patient that there may be a patient responsible charge related to this service. The patient expressed understanding and verbally consented to this telephonic visit.    Interactive audio and video telecommunications were attempted between this provider and patient, however failed, due to patient having technical difficulties OR patient did not have access to video capability.  We continued and completed visit with audio only.  Some vital signs may be absent or patient reported.   Time Spent with patient on telephone encounter: 40 minutes  Subjective:   Jared Cantrell is a 70 y.o. male who presents for Medicare Annual/Subsequent preventive examination.  Review of Systems     Cardiac Risk Factors include: advanced age (>57men, >47 women);dyslipidemia;family history of premature cardiovascular disease;hypertension;male gender     Objective:    Today's Vitals   11/21/20 1056  Weight: 175 lb (79.4 kg)  Height: 5\' 10"  (1.778 m)   Body mass index is 25.11 kg/m.  Advanced Directives 11/21/2020 11/17/2013 11/17/2013  Does Patient Have a Medical Advance Directive? Yes Yes No  Type of Advance Directive Living will;Healthcare Power of Attorney Living will -  Does patient want to make changes to medical advance directive? No - Patient declined No - Patient declined -  Copy of Jared Cantrell in Chart? No - copy requested - -  Would patient like information on creating a medical advance directive? - No - patient declined information No -  patient declined information    Current Medications (verified) Outpatient Encounter Medications as of 11/21/2020  Medication Sig   amitriptyline (ELAVIL) 50 MG tablet Take 1 tablet (50 mg total) by mouth at bedtime.   aspirin 81 MG EC tablet Take 81 mg by mouth daily.   atorvastatin (LIPITOR) 40 MG tablet TAKE 1 TABLET DAILY   losartan (COZAAR) 100 MG tablet TAKE 1 TABLET DAILY   hydrOXYzine (ATARAX/VISTARIL) 10 MG tablet Take 1 tablet (10 mg total) by mouth 3 (three) times daily as needed for itching or anxiety. (Patient not taking: Reported on 11/21/2020)   sildenafil (VIAGRA) 100 MG tablet Take 1 tablet (100 mg total) by mouth as needed for erectile dysfunction.   Testosterone 20.25 MG/ACT (1.62%) GEL Place 1 application onto the skin daily.   No facility-administered encounter medications on file as of 11/21/2020.    Allergies (verified) Patient has no known allergies.   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   HYPERLIPIDEMIA 05/12/2008   HYPERTENSION 04/04/2010   HYPOGONADISM, MALE 05/12/2008   Past Surgical History:  Procedure Laterality Date   COLONOSCOPY     WISDOM TOOTH EXTRACTION     Family History  Problem Relation Age of Onset   Diabetes Mother    Hypertension Mother    Arthritis Mother        RA   Hypertension Sister    Stroke Sister    Cancer Brother        throat cancer/smoker   Hypertension Brother    Colon cancer Neg Hx    Pancreatic cancer  Neg Hx    Rectal cancer Neg Hx    Stomach cancer Neg Hx    Social History   Socioeconomic History   Marital status: Single    Spouse name: Not on file   Number of children: 2   Years of education: Not on file   Highest education level: Not on file  Occupational History   Occupation: VP Finance - [employer]  Tobacco Use   Smoking status: Never   Smokeless tobacco: Never  Substance and Sexual  Activity   Alcohol use: Yes    Alcohol/week: 2.0 standard drinks    Types: 2 Glasses of wine per week    Comment: social   Drug use: No   Sexual activity: Not on file  Other Topics Concern   Not on file  Social History Narrative   Not on file   Social Determinants of Health   Financial Resource Strain: Low Risk    Difficulty of Paying Living Expenses: Not hard at all  Food Insecurity: No Food Insecurity   Worried About Charity fundraiser in the Last Year: Never true   [place] in the Last Year: Never true  Transportation Needs: No Transportation Needs   Lack of Transportation (Medical): No   Lack of Transportation (Non-Medical): No  Physical Activity: Sufficiently Active   Days of Exercise per Week: 5 days   Minutes of Exercise per Session: 30 min  Stress: No Stress Concern Present   Feeling of Stress : Not at all  Social Connections: Socially Integrated   Frequency of Communication with Friends and Family: More than three times a week   Frequency of Social Gatherings with Friends and Family: More than three times a week   Attends Religious Services: More than 4 times per year   Active Member of [employer] or Organizations: Yes   Attends Music therapist: More than 4 times per year   Marital Status: Living with partner    Tobacco Counseling Counseling given: Not Answered   Clinical Intake:  Pre-visit preparation completed: Yes  Pain : No/denies pain     BMI - recorded: 25.11 Nutritional Status: BMI 25 -29 Overweight Nutritional Risks: None Diabetes: No  How often do you need to have someone help you when you read instructions, pamphlets, or other written materials from your doctor or pharmacy?: 1 - Never What is the last grade level you completed in school?: 16 years  Diabetic? no  Interpreter Needed?: No  Information entered by :: Lisette Abu, LPN   Activities of Daily Living In your present state of health, do you have any  difficulty performing the following activities: 11/21/2020 01/19/2020  Hearing? N N  Vision? N N  Difficulty concentrating or making decisions? N N  Walking or climbing stairs? N N  Dressing or bathing? N N  Doing errands, shopping? N N  Preparing Food and eating ? N -  Using the Toilet? N -  In the past six months, have you accidently leaked urine? N -  Do you have problems with loss of bowel control? N -  Managing your Medications? N -  Managing your Finances? N -  Housekeeping or managing your Housekeeping? N -  Some recent data might be hidden    Patient Care Team: Biagio Borg, MD as PCP - General Odette Fraction as Consulting Physician (Optometry)  Indicate any recent Medical Services you may have received from other than [facility] providers in the past year (  date may be approximate).     Assessment:   This is a routine wellness examination for [place].  Hearing/Vision screen Hearing Screening - Comments:: Patient denied any hearing difficulty.   No hearing aids.  Vision Screening - Comments:: Patient wears corrective glasses/contacts.  Eye exam done annually by: Dr. Gwynn Burly at [facility]  Dietary issues and exercise activities discussed: Current Exercise Habits: Home exercise routine, Type of exercise: walking, Time (Minutes): 30, Frequency (Times/Week): 5, Weekly Exercise (Minutes/Week): 150, Intensity: Moderate, Exercise limited by: None identified   Goals Addressed             This Visit's Progress    Patient Stated       To maintain my current health status by continuing to eat healthy, stay physically active and socially active.      Depression Screen PHQ 2/9 Scores 11/21/2020 07/20/2020 07/20/2020 01/19/2020 01/19/2020 07/16/2019 01/13/2019  PHQ - 2 Score 0 0 0 0 0 0 0    Fall Risk Fall Risk  11/21/2020 07/20/2020 07/20/2020 01/19/2020 01/19/2020  Falls in the past year? 0 0 0 0 0  Number falls in past yr: 0 0 0 - 0  Injury with Fall? 0 0 0 - 0  Risk  for fall due to : No Fall Risks - - - No Fall Risks  Follow up Falls evaluation completed - - - Falls evaluation completed    FALL RISK PREVENTION PERTAINING TO THE HOME:  Any stairs in or around the home? Yes  If so, are there any without handrails? No  Home free of loose throw rugs in walkways, pet beds, electrical cords, etc? Yes  Adequate lighting in your home to reduce risk of falls? Yes   ASSISTIVE DEVICES UTILIZED TO PREVENT FALLS:  Life alert? No  Use of a cane, walker or w/c? No  Grab bars in the bathroom? No  Shower chair or bench in shower? No  Elevated toilet seat or a handicapped toilet? No   TIMED UP AND GO:  Was the test performed? No .  Length of time to ambulate 10 feet: n/a sec.   Gait steady and fast without use of assistive device  Cognitive Function: Normal cognitive status assessed by direct observation by this Nurse Health Advisor. No abnormalities found.          Immunizations Immunization History  Administered Date(s) Administered   Fluad Quad(high Dose 65+) 12/19/2018   Influenza, High Dose Seasonal PF 01/09/2018   Influenza,inj,Quad PF,6+ Mos 01/08/2017   Influenza-Unspecified 12/14/2019   PFIZER(Purple Top)SARS-COV-2 Vaccination 04/05/2019, 04/28/2019, 12/14/2019, 07/06/2020   Pneumococcal Conjugate-13 01/23/2018   Pneumococcal Polysaccharide-23 03/25/2019   Td 05/12/2008   Tdap 01/13/2019   Zoster Recombinat (Shingrix) 07/21/2019, 09/21/2019    TDAP status: Up to date  Flu Vaccine status: Due, Education has been provided regarding the importance of this vaccine. Advised may receive this vaccine at local pharmacy or Health Dept. Aware to provide a copy of the vaccination record if obtained from local pharmacy or Health Dept. Verbalized acceptance and understanding.  Pneumococcal vaccine status: Up to date  Covid-19 vaccine status: Completed vaccines  Qualifies for Shingles Vaccine? Yes   Zostavax completed Yes   Shingrix Completed?:  Yes  Screening Tests Health Maintenance  Topic Date Due   INFLUENZA VACCINE  09/19/2020   COLONOSCOPY (Pts 45-48yrs Insurance coverage will need to be confirmed)  12/02/2023   TETANUS/TDAP  01/12/2029   COVID-19 Vaccine  Completed   Hepatitis C Screening  Completed  Zoster Vaccines- Shingrix  Completed   HPV VACCINES  Aged Out    Health Maintenance  Health Maintenance Due  Topic Date Due   INFLUENZA VACCINE  09/19/2020    Colorectal cancer screening: Type of screening: Colonoscopy. Completed 12/01/2013. Repeat every 10 years  Lung Cancer Screening: (Low Dose CT Chest recommended if Age 59-80 years, 30 pack-year currently smoking OR have quit w/in 15years.) does not qualify.   Lung Cancer Screening Referral: no  Additional Screening:  Hepatitis C Screening: does qualify; Completed yes  Vision Screening: Recommended annual ophthalmology exams for early detection of glaucoma and other disorders of the eye. Is the patient up to date with their annual eye exam?  Yes  Who is the provider or what is the name of the office in which the patient attends annual eye exams? Dr. Francis Dowse If pt is not established with a provider, would they like to be referred to a provider to establish care? No .   Dental Screening: Recommended annual dental exams for proper oral hygiene  Community Resource Referral / Chronic Care Management: CRR required this visit?  No   CCM required this visit?  No      Plan:     I have personally reviewed and noted the following in the patient's chart:   Medical and social history Use of alcohol, tobacco or illicit drugs  Current medications and supplements including opioid prescriptions. Patient is not currently taking opioid prescriptions. Functional ability and status Nutritional status Physical activity Advanced directives List of other physicians Hospitalizations, surgeries, and ER visits in previous 12 months Vitals Screenings to  include cognitive, depression, and falls Referrals and appointments  In addition, I have reviewed and discussed with patient certain preventive protocols, quality metrics, and best practice recommendations. A written personalized care plan for preventive services as well as general preventive health recommendations were provided to patient.     Sheral Flow, LPN   [id number]/[id number]   Nurse Notes:  Patient is cogitatively intact. There were no vitals filed for this visit. Patient stated that she has no issues with gait or balance; does not use any assistive devices.   Medical screening examination/treatment/procedure(s) were performed by non-physician practitioner and as supervising physician I was immediately available for consultation/collaboration.  I agree with above. Cathlean Cower, MD

## 2021-04-17 ENCOUNTER — Other Ambulatory Visit: Payer: Self-pay | Admitting: Internal Medicine

## 2021-04-17 NOTE — Telephone Encounter (Signed)
Please refill as per office routine med refill policy (all routine meds to be refilled for 3 mo or monthly (per pt preference) up to one year from last visit, then month to month grace period for 3 mo, then further med refills will have to be denied) ? ?

## 2021-07-28 ENCOUNTER — Other Ambulatory Visit: Payer: Self-pay | Admitting: Internal Medicine

## 2021-07-28 NOTE — Telephone Encounter (Signed)
Please refill as per office routine med refill policy (all routine meds to be refilled for 3 mo or monthly (per pt preference) up to one year from last visit, then month to month grace period for 3 mo, then further med refills will have to be denied) ? ?

## 2021-10-18 ENCOUNTER — Other Ambulatory Visit: Payer: Self-pay | Admitting: Internal Medicine

## 2021-10-18 NOTE — Telephone Encounter (Signed)
Please refill as per office routine med refill policy (all routine meds to be refilled for 3 mo or monthly (per pt preference) up to one year from last visit, then month to month grace period for 3 mo, then further med refills will have to be denied) ? ?

## 2022-01-04 ENCOUNTER — Ambulatory Visit (INDEPENDENT_AMBULATORY_CARE_PROVIDER_SITE_OTHER): Payer: Medicare Other | Admitting: Internal Medicine

## 2022-01-04 ENCOUNTER — Encounter: Payer: Self-pay | Admitting: Internal Medicine

## 2022-01-04 VITALS — BP 126/86 | HR 75 | Temp 98.1°F | Ht 70.0 in | Wt 181.0 lb

## 2022-01-04 DIAGNOSIS — R972 Elevated prostate specific antigen [PSA]: Secondary | ICD-10-CM

## 2022-01-04 DIAGNOSIS — L989 Disorder of the skin and subcutaneous tissue, unspecified: Secondary | ICD-10-CM

## 2022-01-04 DIAGNOSIS — E559 Vitamin D deficiency, unspecified: Secondary | ICD-10-CM

## 2022-01-04 DIAGNOSIS — Z23 Encounter for immunization: Secondary | ICD-10-CM

## 2022-01-04 DIAGNOSIS — E7800 Pure hypercholesterolemia, unspecified: Secondary | ICD-10-CM

## 2022-01-04 DIAGNOSIS — R739 Hyperglycemia, unspecified: Secondary | ICD-10-CM

## 2022-01-04 DIAGNOSIS — E538 Deficiency of other specified B group vitamins: Secondary | ICD-10-CM | POA: Diagnosis not present

## 2022-01-04 DIAGNOSIS — I10 Essential (primary) hypertension: Secondary | ICD-10-CM

## 2022-01-04 LAB — VITAMIN D 25 HYDROXY (VIT D DEFICIENCY, FRACTURES): VITD: 44.49 ng/mL (ref 30.00–100.00)

## 2022-01-04 LAB — CBC WITH DIFFERENTIAL/PLATELET
Basophils Absolute: 0 10*3/uL (ref 0.0–0.1)
Basophils Relative: 0.6 % (ref 0.0–3.0)
Eosinophils Absolute: 0.3 10*3/uL (ref 0.0–0.7)
Eosinophils Relative: 7 % — ABNORMAL HIGH (ref 0.0–5.0)
HCT: 48.8 % (ref 39.0–52.0)
Hemoglobin: 15.5 g/dL (ref 13.0–17.0)
Lymphocytes Relative: 39.6 % (ref 12.0–46.0)
Lymphs Abs: 1.5 10*3/uL (ref 0.7–4.0)
MCHC: 31.7 g/dL (ref 30.0–36.0)
MCV: 81.7 fl (ref 78.0–100.0)
Monocytes Absolute: 0.4 10*3/uL (ref 0.1–1.0)
Monocytes Relative: 10.9 % (ref 3.0–12.0)
Neutro Abs: 1.6 10*3/uL (ref 1.4–7.7)
Neutrophils Relative %: 41.9 % — ABNORMAL LOW (ref 43.0–77.0)
Platelets: 219 10*3/uL (ref 150.0–400.0)
RBC: 5.97 Mil/uL — ABNORMAL HIGH (ref 4.22–5.81)
RDW: 15.7 % — ABNORMAL HIGH (ref 11.5–15.5)
WBC: 3.8 10*3/uL — ABNORMAL LOW (ref 4.0–10.5)

## 2022-01-04 LAB — BASIC METABOLIC PANEL
BUN: 9 mg/dL (ref 6–23)
CO2: 31 mEq/L (ref 19–32)
Calcium: 10.6 mg/dL — ABNORMAL HIGH (ref 8.4–10.5)
Chloride: 101 mEq/L (ref 96–112)
Creatinine, Ser: 0.97 mg/dL (ref 0.40–1.50)
GFR: 79.94 mL/min (ref >60.00)
Glucose, Bld: 96 mg/dL (ref 70–99)
Potassium: 4.6 mEq/L (ref 3.5–5.1)
Sodium: 136 mEq/L (ref 135–145)

## 2022-01-04 LAB — HEPATIC FUNCTION PANEL
ALT: 33 U/L (ref 0–53)
AST: 22 U/L (ref 0–37)
Albumin: 4.3 g/dL (ref 3.5–5.2)
Alkaline Phosphatase: 69 U/L (ref 39–117)
Bilirubin, Direct: 0.1 mg/dL (ref 0.0–0.3)
Total Bilirubin: 0.8 mg/dL (ref 0.2–1.2)
Total Protein: 7.1 g/dL (ref 6.0–8.3)

## 2022-01-04 LAB — URINALYSIS, ROUTINE W REFLEX MICROSCOPIC
Bilirubin Urine: NEGATIVE
Hgb urine dipstick: NEGATIVE
Ketones, ur: NEGATIVE
Leukocytes,Ua: NEGATIVE
Nitrite: NEGATIVE
RBC / HPF: NONE SEEN (ref 0–?)
Specific Gravity, Urine: 1.01 (ref 1.000–1.030)
Total Protein, Urine: NEGATIVE
Urine Glucose: NEGATIVE
Urobilinogen, UA: 0.2 (ref 0.0–1.0)
pH: 7.5 (ref 5.0–8.0)

## 2022-01-04 LAB — LIPID PANEL
Cholesterol: 192 mg/dL (ref 0–200)
HDL: 59.2 mg/dL (ref >39.00)
LDL Cholesterol: 113 mg/dL — ABNORMAL HIGH (ref 0–99)
NonHDL: 132.31
Total CHOL/HDL Ratio: 3
Triglycerides: 98 mg/dL (ref 0.0–149.0)
VLDL: 19.6 mg/dL (ref 0.0–40.0)

## 2022-01-04 LAB — PSA: PSA: 7.54 ng/mL — ABNORMAL HIGH (ref 0.10–4.00)

## 2022-01-04 LAB — VITAMIN B12: Vitamin B-12: 466 pg/mL (ref 211–911)

## 2022-01-04 LAB — HEMOGLOBIN A1C: Hgb A1c MFr Bld: 6.4 % (ref 4.6–6.5)

## 2022-01-04 LAB — TSH: TSH: 1.04 u[IU]/mL (ref 0.35–5.50)

## 2022-01-04 MED ORDER — LOSARTAN POTASSIUM 100 MG PO TABS: 100.0000 mg | ORAL_TABLET | Freq: Every day | ORAL | 3 refills | Status: DC

## 2022-01-04 MED ORDER — ATORVASTATIN CALCIUM 40 MG PO TABS: 40.0000 mg | ORAL_TABLET | Freq: Every day | ORAL | 3 refills | Status: DC

## 2022-01-04 NOTE — Assessment & Plan Note (Signed)
Lab Results  Component Value Date   PSA 1.11 01/19/2020   PSA 1.14 01/09/2019   PSA 1.22 01/09/2018   Also for f/u psa per pt request

## 2022-01-04 NOTE — Assessment & Plan Note (Signed)
Lab Results  Component Value Date   LDLCALC 95 07/20/2020   Stable, pt to continue current statin lipitor 40 mg qd

## 2022-01-04 NOTE — Progress Notes (Signed)
Patient ID: Jared Cantrell, male   DOB: 30-Aug-1950, 71 y.o.   MRN: [id number]         Chief Complaint:: yearly exam       HPI:  Jared Cantrell is a 71 y.o. male here due for flu shot; Pt denies chest pain, increased sob or doe, wheezing, orthopnea, PND, increased LE swelling, palpitations, dizziness or syncope.   Pt denies polydipsia, polyuria, or new focal neuro s/s.    Pt denies fever, wt loss, night sweats, loss of appetite, or other constitutional symptoms  Denies worsening depressive symptoms, suicidal ideation, or panic.  Does have skin lesion at the index finger MCP ? Wart but getting larger - asks for derm referral.  Not taking Vit D  Did also have PSA about 6 recently, pt asks for f/u psa here today,   Denies urinary symptoms such as dysuria, frequency, urgency, flank pain, hematuria or n/v, fever, chills.     Wt Readings from Last 3 Encounters:  01/04/22 181 lb (82.1 kg)  11/21/20 175 lb (79.4 kg)  07/20/20 174 lb (78.9 kg)   BP Readings from Last 3 Encounters:  01/04/22 126/86  07/20/20 124/78  01/19/20 134/90   Immunization History  Administered Date(s) Administered   Fluad Quad(high Dose 65+) 12/19/2018   Influenza, High Dose Seasonal PF 01/09/2018   Influenza,inj,Quad PF,6+ Mos 01/08/2017   Influenza-Unspecified 12/14/2019   PFIZER(Purple Top)SARS-COV-2 Vaccination 04/05/2019, 04/28/2019, 12/14/2019, 07/06/2020   Pneumococcal Conjugate-13 01/23/2018   Pneumococcal Polysaccharide-23 03/25/2019   Td 05/12/2008   Tdap 01/13/2019   Zoster Recombinat (Shingrix) 07/21/2019, 09/21/2019   Health Maintenance Due  Topic Date Due   INFLUENZA VACCINE  09/19/2021   Medicare Annual Wellness (AWV)  11/21/2021      Past Medical History:  Diagnosis Date   ANXIETY 05/12/2008   BENIGN PROSTATIC HYPERTROPHY 05/12/2008   ERECTILE DYSFUNCTION, 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 He has never used smokeless tobacco. He reports current alcohol use of about 2.0 standard drinks of alcohol per week. He reports that he does not use drugs. family history includes Arthritis in his mother; Cancer in his brother; Diabetes in his mother; Hypertension in his brother, mother, and sister; Stroke in his sister. No Known Allergies Current Outpatient Medications on File Prior to Visit  Medication Sig Dispense Refill   amitriptyline (ELAVIL) 50 MG tablet Take 1 tablet (50 mg total) by mouth at bedtime. 90 tablet 3   aspirin 81 MG EC tablet Take 81 mg by mouth daily.     hydrOXYzine (ATARAX/VISTARIL) 10 MG tablet Take 1 tablet (10 mg total) by mouth 3 (three) times daily as needed for itching or anxiety. 60 tablet 5   sildenafil (VIAGRA) 100 MG tablet Take 1 tablet (100 mg total) by mouth as needed for erectile dysfunction. 10 tablet 5   Testosterone 20.25 MG/ACT (1.62%) GEL Place 1 application onto the skin daily. 225 g 1   No current facility-administered medications on file prior to visit.        ROS:  All others reviewed and negative.  Objective        PE:  BP 126/86 (BP Location: Right Arm, Patient Position: Sitting, Cuff Size: Large)   Pulse 75   Temp 98.1 F (36.7 C) (Oral)  Ht '5\' 10"'$  (1.778 m)   Wt 181 lb (82.1 kg)   SpO2 98%   BMI 25.97 kg/m                 Constitutional: Pt appears in NAD               HENT: Head: NCAT.                Right Ear: External ear normal.                 Left Ear: External ear normal.                Eyes: . Pupils are equal, round, and reactive to light. Conjunctivae and EOM are normal               Nose: without d/c or deformity               Neck: Neck supple. Gross normal ROM               Cardiovascular: Normal rate and regular rhythm.                 Pulmonary/Chest: Effort normal and breath sounds without rales or  wheezing.                Abd:  Soft, NT, ND, + BS, no organomegaly               Neurological: Pt is alert. At baseline orientation, motor grossly intact               Skin: Skin is warm.  LE edema - none, Right hand index finger post MCP warty type lesion about 11 mm slightly raised               Psychiatric: Pt behavior is normal without agitation   Micro: none  Cardiac tracings I have personally interpreted today:  none  Pertinent Radiological findings (summarize): none   Lab Results  Component Value Date   WBC 3.5 (L) 07/20/2020   HGB 14.4 07/20/2020   HCT 44.9 07/20/2020   PLT 196.0 07/20/2020   GLUCOSE 73 01/19/2020   CHOL 168 07/20/2020   TRIG 75.0 07/20/2020   HDL 58.50 07/20/2020   LDLCALC 95 07/20/2020   ALT 33 07/20/2020   AST 27 07/20/2020   NA 137 01/19/2020   K 4.7 01/19/2020   CL 102 01/19/2020   CREATININE 0.92 01/19/2020   BUN 9 01/19/2020   CO2 34 (H) 01/19/2020   TSH 1.09 01/19/2020   PSA 1.11 01/19/2020   Assessment/Plan:  Jared Cantrell is a 71 y.o. Black or African American [2] male with  has a past medical history of ANXIETY (05/12/2008), BENIGN PROSTATIC HYPERTROPHY (05/12/2008), ERECTILE DYSFUNCTION, ORGANIC (04/04/2010), HYPERLIPIDEMIA (05/12/2008), HYPERTENSION (04/04/2010), and HYPOGONADISM, MALE (05/12/2008).  Vitamin D deficiency Last vitamin D Lab Results  Component Value Date   VD25OH 29.96 (L) 07/20/2020   Low, to start oral replacement   Hyperlipidemia Lab Results  Component Value Date   LDLCALC 95 07/20/2020   Stable, pt to continue current statin lipitor 40 mg qd   Essential hypertension BP Readings from Last 3 Encounters:  01/04/22 126/86  07/20/20 124/78  01/19/20 134/90   Stable, pt to continue medical treatment losartan 100 mg qd   Increased prostate specific antigen (PSA) velocity Lab Results  Component Value Date   PSA 1.11 01/19/2020   PSA 1.14 01/09/2019   PSA  1.22 01/09/2018   Also for f/u psa per pt  request  Skin lesion Right hand index finger post MCP - for derm referral Followup: No follow-ups on file.  Cathlean Cower, MD 01/04/2022 10:14 AM [facility]

## 2022-01-04 NOTE — Assessment & Plan Note (Signed)
Last vitamin D Lab Results  Component Value Date   VD25OH 29.96 (L) 07/20/2020   Low, to start oral replacement

## 2022-01-04 NOTE — Addendum Note (Signed)
Addended by: Earnstine Regal on: 01/04/2022 10:17 AM   Modules accepted: Orders

## 2022-01-04 NOTE — Assessment & Plan Note (Signed)
Right hand index finger post MCP - for derm referral

## 2022-01-04 NOTE — Assessment & Plan Note (Signed)
BP Readings from Last 3 Encounters:  01/04/22 126/86  07/20/20 124/78  01/19/20 134/90   Stable, pt to continue medical treatment losartan 100 mg qd

## 2022-01-04 NOTE — Patient Instructions (Signed)
You had the flu shot today  You will be contacted regarding the referral for: Dermatology  Please continue all other medications as before, and refills have been done if requested.  Please have the pharmacy call with any other refills you may need.  Please continue your efforts at being more active, low cholesterol diet, and weight control.  You are otherwise up to date with prevention measures today.  Please keep your appointments with your specialists as you may have planned  Please go to the LAB at the blood drawing area for the tests to be done  You will be contacted by phone if any changes need to be made immediately.  Otherwise, you will receive a letter about your results with an explanation, but please check with MyChart first.  Please remember to sign up for MyChart if you have not done so, as this will be important to you in the future with finding out test results, communicating by private email, and scheduling acute appointments online when needed.  Please make an Appointment to return for your 1 year visit, or sooner if needed

## 2022-01-05 ENCOUNTER — Encounter: Payer: Self-pay | Admitting: Internal Medicine

## 2022-01-08 ENCOUNTER — Ambulatory Visit: Payer: Medicare Other | Admitting: *Deleted

## 2022-03-19 ENCOUNTER — Other Ambulatory Visit (HOSPITAL_COMMUNITY): Payer: Self-pay | Admitting: Urology

## 2022-03-19 DIAGNOSIS — C61 Malignant neoplasm of prostate: Secondary | ICD-10-CM

## 2022-03-28 ENCOUNTER — Encounter (HOSPITAL_COMMUNITY)
Admission: RE | Admit: 2022-03-28 | Discharge: 2022-03-28 | Disposition: A | Discharge status: Home / Self Care | Payer: Medicare Other | Source: Ambulatory Visit | Attending: Urology | Admitting: Urology

## 2022-03-28 DIAGNOSIS — C61 Malignant neoplasm of prostate: Secondary | ICD-10-CM | POA: Insufficient documentation

## 2022-03-28 MED ORDER — PIFLIFOLASTAT F 18 (PYLARIFY) INJECTION
9.0000 | Freq: Once | INTRAVENOUS | Status: AC
  Administered 2022-03-28: 9.88 via INTRAVENOUS

## 2022-04-13 ENCOUNTER — Telehealth: Payer: Self-pay | Admitting: Radiation Oncology

## 2022-04-13 NOTE — Progress Notes (Signed)
GU Location of Tumor / Histology: Prostate Ca  If Prostate Cancer, Gleason Score is (4 + 4) and PSA is (7.2 on 01/2022)  Biopsies       Past/Anticipated interventions by urology, if any:     Past/Anticipated interventions by medical oncology, if any:   Weight changes, if any:   IPSS: SHIM:  Bowel/Bladder complaints, if any: {:18581}   Nausea/Vomiting, if any: {:18581}  Pain issues, if any:  {:18581}  SAFETY ISSUES: Prior radiation? {:18581} Pacemaker/ICD? {:18581} Possible current pregnancy?  Male Is the patient on methotrexate? No  Current Complaints / other details:

## 2022-04-13 NOTE — Telephone Encounter (Signed)
Lvm to schedule CON with Dr. Tammi Klippel

## 2022-04-15 NOTE — Progress Notes (Signed)
Radiation Oncology         [phone] ________________________________  Initial Outpatient Consultation  Name: Jared Cantrell MRN: [id number]  Date: 04/16/2022  DOB: [birth year]  [id number], Hunt Oris, MD  Franchot Gallo, MD   REFERRING PHYSICIAN: Franchot Gallo, MD  DIAGNOSIS: 72 y.o. gentleman with Stage T1c adenocarcinoma of the prostate with Gleason score of 4+5, and PSA of 7.2.  No diagnosis found.  HISTORY OF PRESENT ILLNESS: Jared Cantrell is a 72 y.o. male with a diagnosis of prostate cancer. He has been followed by Dr. Diona Fanti in urology since 01/2006 for low testosterone and erectile dysfunction. He has been on varying doses of AndroGel for the low testosterone, even before he came under the care of Dr. Diona Fanti. His PSA remained under 1 while on androgen replacement until 2020. Even after decreasing his AndroGel dose, his PSA continued to rise and jumped from 2.41 in 12/2020 to 6.61 in 12/2021. Digital rectal examination was performed at follow up on 01/03/22 showing no nodules or other abnormalities. A repeat PSA obtained four weeks later confirmed significant elevation at 7.2. The patient proceeded to transrectal ultrasound with 12 biopsies of the prostate on 03/02/22.  The prostate volume measured 35 cc.  Out of 12 core biopsies, 6 were positive, all left-sided cores.  The maximum Gleason score was 4+5, and this was seen in apex lateral and mid. Additionally, Gleason 4+4 was seen in apex, base, and base lateral, and Gleason 4+3 in mid lateral.  He underwent staging PSMA PET scan on 03/28/22 showing no evidence of radiotracer activity outside of the prostate.  The patient reviewed the biopsy results with his urologist and he has kindly been referred today for discussion of potential radiation treatment options.   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   HYPERLIPIDEMIA 05/12/2008   HYPERTENSION 04/04/2010   HYPOGONADISM, MALE 05/12/2008      PAST SURGICAL HISTORY: Past Surgical History:  Procedure Laterality Date   COLONOSCOPY     WISDOM TOOTH EXTRACTION      FAMILY HISTORY:  Family History  Problem Relation Age of Onset   Diabetes Mother    Hypertension Mother    Arthritis Mother        RA   Hypertension Sister    Stroke Sister    Cancer Brother        throat cancer/smoker   Hypertension Brother    Colon cancer Neg Hx    Pancreatic cancer Neg Hx    Rectal cancer Neg Hx    Stomach cancer Neg Hx     SOCIAL HISTORY:  Social History   Socioeconomic History   Marital status: Single    Spouse name: Not on file   Number of children: 2   Years of education: Not on file   Highest education level: Not on file  Occupational History   Occupation: VP Finance - [employer]  Tobacco Use   Smoking status: Never   Smokeless tobacco: Never  Substance and Sexual Activity   Alcohol use: Yes    Alcohol/week: 2.0 standard drinks of alcohol    Types: 2 Glasses of wine per week    Comment: social   Drug use: No   Sexual activity: Not on file  Other Topics Concern   Not on file  Social History Narrative   Not on file  Social Determinants of Health   Financial Resource Strain: Low Risk  (11/21/2020)   Overall Financial Resource Strain (CARDIA)    Difficulty of Paying Living Expenses: Not hard at all  Food Insecurity: No Food Insecurity (11/21/2020)   Hunger Vital Sign    Worried About Running Out of Food in the Last Year: Never true    Ran Out of Food in the Last Year: Never true  Transportation Needs: No Transportation Needs (11/21/2020)   PRAPARE - Hydrologist (Medical): No    Lack of Transportation (Non-Medical): No  Physical Activity: Sufficiently Active (11/21/2020)   Exercise Vital Sign    Days of Exercise per Week: 5 days    Minutes of  Exercise per Session: 30 min  Stress: No Stress Concern Present (11/21/2020)   [place]    Feeling of Stress : Not at all  Social Connections: [place] (11/21/2020)   Social Connection and Isolation Panel [NHANES]    Frequency of Communication with Friends and Family: More than three times a week    Frequency of Social Gatherings with Friends and Family: More than three times a week    Attends Religious Services: More than 4 times per year    Active Member of [employer] or Organizations: Yes    Attends Archivist Meetings: More than 4 times per year    Marital Status: Living with partner  Intimate Partner Violence: Not At Risk (11/21/2020)   Humiliation, Afraid, Rape, and Kick questionnaire    Fear of Current or Ex-Partner: No    Emotionally Abused: No    Physically Abused: No    Sexually Abused: No    ALLERGIES: Patient has no known allergies.  MEDICATIONS:  Current Outpatient Medications  Medication Sig Dispense Refill   amitriptyline (ELAVIL) 50 MG tablet Take 1 tablet (50 mg total) by mouth at bedtime. 90 tablet 3   aspirin 81 MG EC tablet Take 81 mg by mouth daily.     atorvastatin (LIPITOR) 40 MG tablet Take 1 tablet (40 mg total) by mouth daily. 90 tablet 3   hydrOXYzine (ATARAX/VISTARIL) 10 MG tablet Take 1 tablet (10 mg total) by mouth 3 (three) times daily as needed for itching or anxiety. 60 tablet 5   losartan (COZAAR) 100 MG tablet Take 1 tablet (100 mg total) by mouth daily. 90 tablet 3   sildenafil (VIAGRA) 100 MG tablet Take 1 tablet (100 mg total) by mouth as needed for erectile dysfunction. 10 tablet 5   Testosterone 20.25 MG/ACT (1.62%) GEL Place 1 application onto the skin daily. 225 g 1   No current facility-administered medications for this encounter.    REVIEW OF SYSTEMS:  On review of systems, the patient reports that he is doing well overall. He denies any chest pain,  shortness of breath, cough, fevers, chills, night sweats, unintended weight changes. He denies any bowel disturbances, and denies abdominal pain, nausea or vomiting. He denies any new musculoskeletal or joint aches or pains. His IPSS was ***, indicating *** urinary symptoms. His SHIM was ***, indicating he {does not have/has mild/moderate/severe} erectile dysfunction. A complete review of systems is obtained and is otherwise negative.    PHYSICAL EXAM:  Wt Readings from Last 3 Encounters:  01/04/22 181 lb (82.1 kg)  11/21/20 175 lb (79.4 kg)  07/20/20 174 lb (78.9 kg)   Temp Readings from Last 3 Encounters:  01/04/22 98.1 F (36.7 C) (Oral)  07/20/20 98 F (36.7 C) (Oral)  01/19/20 97.7 F (36.5 C) (Oral)   BP Readings from Last 3 Encounters:  01/04/22 126/86  07/20/20 124/78  01/19/20 134/90   Pulse Readings from Last 3 Encounters:  01/04/22 75  07/20/20 74  01/19/20 92    /10  In general this is a well appearing *** male in no acute distress. He's alert and oriented x4 and appropriate throughout the examination. Cardiopulmonary assessment is negative for acute distress, and he exhibits normal effort.     KPS = ***  100 - Normal; no complaints; no evidence of disease. 90   - Able to carry on normal activity; minor signs or symptoms of disease. 80   - Normal activity with effort; some signs or symptoms of disease. 77   - Cares for self; unable to carry on normal activity or to do active work. 60   - Requires occasional assistance, but is able to care for most of his personal needs. 50   - Requires considerable assistance and frequent medical care. 62   - Disabled; requires special care and assistance. 65   - Severely disabled; hospital admission is indicated although death not imminent. 9   - Very sick; hospital admission necessary; active supportive treatment necessary. 10   - Moribund; fatal processes progressing rapidly. 0     - Dead  Karnofsky DA, Abelmann [place],  Craver LS and Burchenal [facility] [phone]) The use of the nitrogen mustards in the palliative treatment of carcinoma: with particular reference to bronchogenic carcinoma Cancer 1 634-56  LABORATORY DATA:  Lab Results  Component Value Date   WBC 3.8 (L) 01/04/2022   HGB 15.5 01/04/2022   HCT 48.8 01/04/2022   MCV 81.7 01/04/2022   PLT 219.0 01/04/2022   Lab Results  Component Value Date   NA 136 01/04/2022   K 4.6 01/04/2022   CL 101 01/04/2022   CO2 31 01/04/2022   Lab Results  Component Value Date   ALT 33 01/04/2022   AST 22 01/04/2022   ALKPHOS 69 01/04/2022   BILITOT 0.8 01/04/2022     RADIOGRAPHY: NM PET (PSMA) SKULL TO MID THIGH  Result Date: 03/29/2022 CLINICAL DATA:  Prostate carcinoma with biochemical recurrence. EXAM: NUCLEAR MEDICINE PET SKULL BASE TO THIGH TECHNIQUE: 9.9 mCi F18 Piflufolastat (Pylarify) was injected intravenously. Full-ring PET imaging was performed from the skull base to thigh after the radiotracer. CT data was obtained and used for attenuation correction and anatomic localization. COMPARISON:  None Available. FINDINGS: NECK No radiotracer activity in neck lymph nodes. Incidental CT finding: None. CHEST No radiotracer accumulation within mediastinal or hilar lymph nodes. No suspicious pulmonary nodules on the CT scan. Incidental CT finding: None. ABDOMEN/PELVIS Prostate: Intense radiotracer activity within the LEFT lobe of the prostate gland (SUV max equal 28). Image 211. Lymph nodes: No abnormal radiotracer accumulation within pelvic or abdominal nodes. Liver: No evidence of liver metastasis. Incidental CT finding: None. SKELETON No focal activity to suggest skeletal metastasis. No sclerotic or lytic lesions on CT imaging. IMPRESSION: 1. Intense radiotracer activity within the LEFT lobe of the prostate gland consistent primary prostate adenocarcinoma. 2. No evidence of metastatic adenopathy in the pelvis or periaortic retroperitoneum. 3. No evidence of visceral  metastasis or skeletal metastasis. Electronically Signed   By: Suzy Bouchard M.D.   On: 03/29/2022 10:59      IMPRESSION/PLAN: 1. 72 y.o. gentleman with Stage T1c adenocarcinoma of the prostate with Gleason Score of 4+5, and PSA of 7.2.  We discussed the patient's workup and outlined the nature of prostate cancer in this setting. The patient's T stage, Gleason's score, and PSA put him into the high risk group. Accordingly, he is eligible for a variety of potential treatment options including LT-ADT in combination with 8 weeks of external radiation, 5 weeks of external radiation with an upfront brachytherapy boost, or prostatectomy. We discussed the available radiation techniques, and focused on the details and logistics of delivery. We discussed and outlined the risks, benefits, short and long-term effects associated with radiotherapy and compared and contrasted these with prostatectomy. We discussed the role of SpaceOAR gel in reducing the rectal toxicity associated with radiotherapy. We also detailed the role of ADT in the treatment of high risk prostate cancer and outlined the associated side effects that could be expected with this therapy. He appears to have a good understanding of his disease and our treatment recommendations which are of curative intent.  He was encouraged to ask questions that were answered to his stated satisfaction.  At the conclusion of our conversation, the patient is interested in moving forward with ***.  We personally spent *** minutes in this encounter including chart review, reviewing radiological studies, meeting face-to-face with the patient, entering orders and completing documentation.     Leona Singleton, PA-C    Tyler Pita, MD  [facility] Direct Dial: [phone]  Fax: [phone] [email or web address]  Skype  [email or web address]   This document serves as a record of services personally performed by Tyler Pita, MD and Leona Singleton, PA-C. It  was created on their behalf by Wilburn Mylar, a trained medical scribe. The creation of this record is based on the scribe's personal observations and the provider's statements to them. This document has been checked and approved by the attending provider.

## 2022-04-16 ENCOUNTER — Other Ambulatory Visit: Payer: Self-pay

## 2022-04-16 ENCOUNTER — Ambulatory Visit
Admission: RE | Admit: 2022-04-16 | Discharge: 2022-04-16 | Disposition: A | Discharge status: Home / Self Care | Payer: Medicare Other | Source: Ambulatory Visit | Attending: Radiation Oncology | Admitting: Radiation Oncology

## 2022-04-16 ENCOUNTER — Encounter: Payer: Self-pay | Admitting: Radiation Oncology

## 2022-04-16 VITALS — BP 130/85 | HR 88 | Temp 97.3°F | Resp 20 | Ht 70.0 in | Wt 181.0 lb

## 2022-04-16 DIAGNOSIS — Z801 Family history of malignant neoplasm of trachea, bronchus and lung: Secondary | ICD-10-CM | POA: Insufficient documentation

## 2022-04-16 DIAGNOSIS — C61 Malignant neoplasm of prostate: Secondary | ICD-10-CM | POA: Diagnosis present

## 2022-04-16 DIAGNOSIS — I10 Essential (primary) hypertension: Secondary | ICD-10-CM | POA: Insufficient documentation

## 2022-04-16 DIAGNOSIS — Z7982 Long term (current) use of aspirin: Secondary | ICD-10-CM | POA: Diagnosis not present

## 2022-04-16 DIAGNOSIS — E785 Hyperlipidemia, unspecified: Secondary | ICD-10-CM | POA: Diagnosis not present

## 2022-04-16 DIAGNOSIS — Z79899 Other long term (current) drug therapy: Secondary | ICD-10-CM | POA: Insufficient documentation

## 2022-04-16 NOTE — Progress Notes (Signed)
Introduced myself to the patient as the prostate nurse navigator.  He is here to discuss his radiation treatment options.  Patient is also interested in learning more about surgical options and I informed patient I would be glad to follow up to ensure he gets scheduled for surgical consult with Dr. Alinda Money @ [facility].  I gave him my business card and asked him to call me with questions or concerns.  Verbalized understanding.

## 2022-04-19 NOTE — Progress Notes (Signed)
Patient was RadOnc Consult on 2/26 for his Stage T1c adenocarcinoma of the prostate with Gleason score of 4+5, and PSA of 7.2.   Patient has decided to proceed LT-ADT followed by brachyboost and 5 weeks of radiation.    RN explained next steps, verbalized understanding.   Pending appointment for ADT at this time.   Plan of care in progress.

## 2022-04-24 NOTE — Progress Notes (Signed)
Pt is scheduled to start ADT @ [facility] on 3/8.  Pt aware of appointment.

## 2022-05-08 ENCOUNTER — Telehealth: Payer: Self-pay | Admitting: *Deleted

## 2022-05-08 ENCOUNTER — Other Ambulatory Visit: Payer: Self-pay | Admitting: Urology

## 2022-05-08 NOTE — Telephone Encounter (Signed)
Called patient to ask questions, spoke with patient 

## 2022-05-09 ENCOUNTER — Telehealth: Payer: Self-pay | Admitting: *Deleted

## 2022-05-09 NOTE — Telephone Encounter (Signed)
CALLED PATIENT TO INFORM OF PRE-SEED APPTS. FOR 05-31-22 AND HIS IMPLANT FOR 07-05-22, SPOKE WITH PATIENT AND HE IS AWARE OF THESE APPTS.

## 2022-05-30 ENCOUNTER — Telehealth: Payer: Self-pay | Admitting: *Deleted

## 2022-05-30 NOTE — Telephone Encounter (Signed)
Called patient to remind of pre-seed appts. for 05-31-22, spoke with patient and he is aware of these appts.

## 2022-05-30 NOTE — Progress Notes (Signed)
Radiation Oncology         [phone]) [phone] ________________________________  Outpatient Follow up- Pre-seed visit  Name: Jared Cantrell MRN: [id number]  Date: 05/31/2022  DOB: 02-Jan-1951  SA:YTKZ, [name], MD  Marcine Matar, MD   REFERRING PHYSICIAN: Marcine Matar, MD  DIAGNOSIS: 72 y.o. gentleman with Stage T1c adenocarcinoma of the prostate with Gleason score of 4+5, and PSA of 7.2.     ICD-10-CM   1. Malignant neoplasm of prostate  C61       HISTORY OF PRESENT ILLNESS: Jared Cantrell is a 72 y.o. male with a diagnosis of prostate cancer.  He has been followed by Dr. Retta Diones in urology since 01/2006 for low testosterone and erectile dysfunction. He has been on varying doses of AndroGel for the low testosterone, even before he came under the care of Dr. Retta Diones. His PSA remained under 1 while on androgen replacement until 2020. Even after decreasing his AndroGel dose, his PSA continued to rise and jumped from 2.41 in 12/2020 to 6.61 in 12/2021. Digital rectal examination was performed at follow up on 01/03/22 showing no nodules or other abnormalities. A repeat PSA obtained four weeks later confirmed significant elevation at 7.2. The patient proceeded to transrectal ultrasound with 12 biopsies of the prostate on 03/02/22.  The prostate volume measured 35 cc.  Out of 12 core biopsies, 6 were positive, all left-sided cores.  The maximum Gleason score was 4+5, and this was seen in apex lateral and mid. Additionally, Gleason 4+4 was seen in apex, base, and base lateral, and Gleason 4+3 in mid lateral.   He underwent staging PSMA PET scan on 03/28/22 showing no evidence of radiotracer activity outside of the prostate.  The patient reviewed the biopsy results with his urologist and was kindly referred to [place] for discussion of potential radiation treatment options. We initially met the patient on 04/16/22 and he was undecided regarding his treatment preference at that time.  After  meeting with the surgeon to discuss prostatectomy and getting further consideration to all of his treatment options, he has ultimately elected to proceed with LT-ADT concurrent with a brachytherapy seed boost followed by 5 weeks of daily external beam radiation to the prostate and pelvis for treatment of his disease.  He was started on ADT 04/27/2022 and is here today for his pre-procedure imaging for planning and to answer any additional questions he may have about this treatment.   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   HYPERLIPIDEMIA 05/12/2008   HYPERTENSION 04/04/2010   HYPOGONADISM, MALE 05/12/2008      PAST SURGICAL HISTORY: Past Surgical History:  Procedure Laterality Date   COLONOSCOPY     WISDOM TOOTH EXTRACTION      FAMILY HISTORY:  Family History  Problem Relation Age of Onset   Diabetes Mother    Hypertension Mother    Arthritis Mother        RA   Hypertension Sister    Stroke Sister    Cancer Brother        throat cancer/smoker   Hypertension Brother    Colon cancer Neg Hx    Pancreatic cancer Neg Hx    Rectal cancer Neg Hx    Stomach cancer Neg Hx     SOCIAL HISTORY:  Social History   Socioeconomic History   Marital status: Single  Spouse name: Not on file   Number of children: 2   Years of education: Not on file   Highest education level: Not on file  Occupational History   Occupation: VP Finance - [employer]  Tobacco Use   Smoking status: Never   Smokeless tobacco: Never  Substance and Sexual Activity   Alcohol use: Yes    Alcohol/week: 2.0 standard drinks of alcohol    Types: 2 Glasses of wine per week    Comment: social   Drug use: No   Sexual activity: Not on file  Other Topics Concern   Not on file  Social History Narrative   Not on file   Social Determinants of Health    Financial Resource Strain: Low Risk  (11/21/2020)   Overall Financial Resource Strain (CARDIA)    Difficulty of Paying Living Expenses: Not hard at all  Food Insecurity: No Food Insecurity (04/16/2022)   Hunger Vital Sign    Worried About Running Out of Food in the Last Year: Never true    Ran Out of Food in the Last Year: Never true  Transportation Needs: No Transportation Needs (04/16/2022)   PRAPARE - Administrator, Civil Service (Medical): No    Lack of Transportation (Non-Medical): No  Physical Activity: Sufficiently Active (11/21/2020)   Exercise Vital Sign    Days of Exercise per Week: 5 days    Minutes of Exercise per Session: 30 min  Stress: No Stress Concern Present (11/21/2020)   [employer] of Occupational Health - Occupational Stress Questionnaire    Feeling of Stress : Not at all  Social Connections: Socially Integrated (11/21/2020)   Social Connection and Isolation Panel [NHANES]    Frequency of Communication with Friends and Family: More than three times a week    Frequency of Social Gatherings with Friends and Family: More than three times a week    Attends Religious Services: More than 4 times per year    Active Member of [employer] or Organizations: Yes    Attends Banker Meetings: More than 4 times per year    Marital Status: Living with partner  Intimate Partner Violence: Not At Risk (04/16/2022)   Humiliation, Afraid, Rape, and Kick questionnaire    Fear of Current or Ex-Partner: No    Emotionally Abused: No    Physically Abused: No    Sexually Abused: No    ALLERGIES: Patient has no known allergies.  MEDICATIONS:  Current Outpatient Medications  Medication Sig Dispense Refill   amitriptyline (ELAVIL) 50 MG tablet Take 1 tablet (50 mg total) by mouth at bedtime. 90 tablet 3   aspirin 81 MG EC tablet Take 81 mg by mouth daily.     atorvastatin (LIPITOR) 40 MG tablet Take 1 tablet (40 mg total) by mouth daily. 90 tablet 3   losartan  (COZAAR) 100 MG tablet Take 1 tablet (100 mg total) by mouth daily. 90 tablet 3   Testosterone 20.25 MG/ACT (1.62%) GEL Place 1 application onto the skin daily. 225 g 1   No current facility-administered medications for this visit.    REVIEW OF SYSTEMS:  On review of systems, the patient reports that he is doing well overall. He denies any chest pain, shortness of breath, cough, fevers, chills, night sweats, unintended weight changes. He denies any bowel disturbances, and denies abdominal pain, nausea or vomiting. He denies any new musculoskeletal or joint aches or pains. His IPSS was 11, indicating moderate urinary symptoms. His SHIM was  17, indicating he does have mild erectile dysfunction. A complete review of systems is obtained and is otherwise negative.     PHYSICAL EXAM:  Wt Readings from Last 3 Encounters:  04/16/22 181 lb (82.1 kg)  01/04/22 181 lb (82.1 kg)  11/21/20 175 lb (79.4 kg)   Temp Readings from Last 3 Encounters:  04/16/22 (!) 97.3 F (36.3 C)  01/04/22 98.1 F (36.7 C) (Oral)  07/20/20 98 F (36.7 C) (Oral)   BP Readings from Last 3 Encounters:  04/16/22 130/85  01/04/22 126/86  07/20/20 124/78   Pulse Readings from Last 3 Encounters:  04/16/22 88  01/04/22 75  07/20/20 74    /10  In general this is a well appearing African-American male in no acute distress. He's alert and oriented x4 and appropriate throughout the examination. Cardiopulmonary assessment is negative for acute distress, and he exhibits normal effort.     KPS = 100  100 - Normal; no complaints; no evidence of disease. 90   - Able to carry on normal activity; minor signs or symptoms of disease. 80   - Normal activity with effort; some signs or symptoms of disease. 28   - Cares for self; unable to carry on normal activity or to do active work. 60   - Requires occasional assistance, but is able to care for most of his personal needs. 50   - Requires considerable assistance and frequent  medical care. 40   - Disabled; requires special care and assistance. 30   - Severely disabled; hospital admission is indicated although death not imminent. 20   - Very sick; hospital admission necessary; active supportive treatment necessary. 10   - Moribund; fatal processes progressing rapidly. 0     - Dead  Karnofsky DA, Abelmann WH, Craver LS and Burchenal [facility] [phone]) The use of the nitrogen mustards in the palliative treatment of carcinoma: with particular reference to bronchogenic carcinoma Cancer 1 634-56  LABORATORY DATA:  Lab Results  Component Value Date   WBC 3.8 (L) 01/04/2022   HGB 15.5 01/04/2022   HCT 48.8 01/04/2022   MCV 81.7 01/04/2022   PLT 219.0 01/04/2022   Lab Results  Component Value Date   NA 136 01/04/2022   K 4.6 01/04/2022   CL 101 01/04/2022   CO2 31 01/04/2022   Lab Results  Component Value Date   ALT 33 01/04/2022   AST 22 01/04/2022   ALKPHOS 69 01/04/2022   BILITOT 0.8 01/04/2022     RADIOGRAPHY: No results found.    IMPRESSION/PLAN: 1. 72 y.o. gentleman with Stage T1c adenocarcinoma of the prostate with Gleason score of 4+5, and PSA of 7.2.  The patient has elected to proceed with LT-ADT concurrent with a brachytherapy seed boost followed by 5 weeks of daily external beam radiation to the prostate and pelvis for treatment of his disease.  He was started on ADT 04/27/2022. We reviewed the risks, benefits, short and long-term effects associated with brachytherapy and discussed the role of SpaceOAR in reducing the rectal toxicity associated with radiotherapy.  He appears to have a good understanding of his disease and our treatment recommendations which are of curative intent.  He was encouraged to ask questions that were answered to his stated satisfaction. He has freely signed written consent to proceed today in the office and a copy of this document will be placed in his medical record. His procedure is tentatively scheduled for 07/05/2022 in  collaboration with Dr. Retta Diones and we will see  him back for his post-procedure visit approximately 3 weeks thereafter, at which time we will begin treatment planning for the 5 weeks of daily external beam radiation. We look forward to continuing to participate in his care. He knows that he is welcome to call with any questions or concerns at any time in the interim.  I personally spent 30 minutes in this encounter including chart review, reviewing radiological studies, meeting face-to-face with the patient, entering orders and completing documentation.    Marguarite ArbourAshlyn W. Marybel Alcott, MMS, PA-C [place]  [facility] Physician Assistant Direct Dial: [phone]  Fax: [phone]

## 2022-05-30 NOTE — Progress Notes (Signed)
  Radiation Oncology         [phone] ________________________________  Name: Jared Cantrell MRN: [id number]  Date: 05/31/2022  DOB: 1950/08/11  SIMULATION AND TREATMENT PLANNING NOTE PUBIC ARCH STUDY  ER:XVQM, [name], MD  Marcine Matar, MD  DIAGNOSIS: 72 y.o. gentleman with Stage T1c adenocarcinoma of the prostate with Gleason score of 4+5, and PSA of 7.2.   Oncology History   No history exists.      ICD-10-CM   1. Malignant neoplasm of prostate  C61       COMPLEX SIMULATION:  The patient presented today for evaluation for possible prostate seed implant. He was brought to the radiation planning suite and placed supine on the CT couch. A 3-dimensional image study set was obtained in upload to the planning computer. There, on each axial slice, I contoured the prostate gland. Then, using three-dimensional radiation planning tools I reconstructed the prostate in view of the structures from the transperineal needle pathway to assess for possible pubic arch interference. In doing so, I did not appreciate any pubic arch interference. Also, the patient's prostate volume was estimated based on the drawn structure. The volume was 30 cc.  Given the pubic arch appearance and prostate volume, patient remains a good candidate to proceed with prostate seed implant. Today, he freely provided informed written consent to proceed.    PLAN: The patient will undergo prostate seed implant.   ________________________________  Artist Pais. Kathrynn Running, M.D.

## 2022-05-31 ENCOUNTER — Encounter: Payer: Self-pay | Admitting: Urology

## 2022-05-31 ENCOUNTER — Ambulatory Visit
Admission: RE | Admit: 2022-05-31 | Discharge: 2022-05-31 | Disposition: A | Discharge status: Home / Self Care | Payer: Medicare Other | Source: Ambulatory Visit | Attending: Radiation Oncology | Admitting: Radiation Oncology

## 2022-05-31 ENCOUNTER — Ambulatory Visit
Admission: RE | Admit: 2022-05-31 | Discharge: 2022-05-31 | Disposition: A | Discharge status: Home / Self Care | Payer: Medicare Other | Source: Ambulatory Visit | Attending: Urology | Admitting: Urology

## 2022-05-31 VITALS — Resp 18 | Ht 70.0 in | Wt 179.0 lb

## 2022-05-31 DIAGNOSIS — C61 Malignant neoplasm of prostate: Secondary | ICD-10-CM

## 2022-05-31 NOTE — Progress Notes (Signed)
Pre-seed nursing interview for a diagnosis of 72 y.o. gentleman with Stage T1c adenocarcinoma of the prostate with Gleason score of 4+5, and PSA of 7.2.  Patient identity verified. Patient reports doing well. No issues conveyed at this time.  Meaningful use complete. Urinary Management medication(s)- None Urology appointment date- 07/05/2022, with Dr. Retta Diones  at [facility] [place]  Resp 18   Ht 5\' 10"  (1.778 m)   Wt 179 lb (81.2 kg)   BMI 25.68 kg/m   This concludes the interview.   [name], LPN

## 2022-06-21 ENCOUNTER — Encounter (HOSPITAL_BASED_OUTPATIENT_CLINIC_OR_DEPARTMENT_OTHER): Payer: Self-pay | Admitting: Urology

## 2022-06-21 NOTE — Progress Notes (Signed)
Spoke w/ via phone for pre-op interview---[facility] needs dos----  EKG per anesthesia             Lab results------ COVID test -----patient states asymptomatic no test needed Arrive at -------0930 NPO after MN NO Solid Food.  Clear liquids from MN until---0830 Med rec completed Medications to take morning of surgery -----NONE Diabetic medication ----- Patient instructed no nail polish to be worn day of surgery Patient instructed to bring photo id and insurance card day of surgery Patient aware to have Driver (ride ) / caregiver  Tanna Furry  for 24 hours after surgery  Patient Special Instructions ----- Pre-Op special Instructions ----- FLEETS enema AM of procedure. Patient verbalized understanding of instructions that were given at this phone interview. Patient denies shortness of breath, chest pain, fever, cough at this phone interview.

## 2022-07-02 NOTE — Progress Notes (Signed)
RN left voicemail to assess any additional needs or questions prior to upcoming brachytherapy on 5/16.

## 2022-07-04 ENCOUNTER — Telehealth: Payer: Self-pay | Admitting: *Deleted

## 2022-07-04 NOTE — H&P (Signed)
H&P  Chief Complaint: PCA  History of Present Illness: 72 yo male w/ GG 5 PCa presents for I 125 seed boost/Space OAR in advance of EBRT.  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     Allergies: No Known Allergies  Family History  Problem Relation Age of Onset   Diabetes Mother    Hypertension Mother    Arthritis Mother        RA   Hypertension Sister    Stroke Sister    Cancer Brother        throat cancer/smoker   Hypertension Brother    Colon cancer Neg Hx    Pancreatic cancer Neg Hx    Rectal cancer Neg Hx    Stomach cancer Neg Hx     Social History:  reports that he has never smoked. He has never used smokeless tobacco. He reports current alcohol use of about 2.0 standard drinks of alcohol per week. He reports that he does not use drugs.  ROS: A complete review of systems was performed.  All systems are negative except for pertinent findings as noted.  Physical Exam:  Vital signs in last 24 hours: Ht 5\' 10"  (1.778 m)   Wt 80.7 kg   BMI 25.54 kg/m  Constitutional:  Alert and oriented, No acute distress Cardiovascular: Regular rate  Respiratory: Normal respiratory effort Neurologic: Grossly intact, no focal deficits Psychiatric: Normal mood and affect  I have reviewed prior pt notes  I have reviewed urinalysis results  I have independently reviewed prior imaging  I have reviewed prior PSA/pathology results    Impression/Assessment:  GG5 PCA  Plan:   I 125 brachytherapy/SpaceOAR

## 2022-07-04 NOTE — Telephone Encounter (Signed)
Called patient to remind of procedure for 07-05-22, spoke with patient and he is aware of this procedure

## 2022-07-05 ENCOUNTER — Ambulatory Visit (HOSPITAL_BASED_OUTPATIENT_CLINIC_OR_DEPARTMENT_OTHER): Payer: Medicare Other | Admitting: Anesthesiology

## 2022-07-05 ENCOUNTER — Ambulatory Visit (HOSPITAL_BASED_OUTPATIENT_CLINIC_OR_DEPARTMENT_OTHER)
Admission: RE | Admit: 2022-07-05 | Discharge: 2022-07-05 | Disposition: A | Discharge status: Home / Self Care | Payer: Medicare Other | Attending: Urology | Admitting: Urology

## 2022-07-05 ENCOUNTER — Ambulatory Visit (HOSPITAL_COMMUNITY): Payer: Medicare Other

## 2022-07-05 ENCOUNTER — Other Ambulatory Visit: Payer: Self-pay

## 2022-07-05 ENCOUNTER — Encounter (HOSPITAL_BASED_OUTPATIENT_CLINIC_OR_DEPARTMENT_OTHER)
Admission: RE | Disposition: A | Discharge status: Home / Self Care | Payer: Self-pay | Source: Home / Self Care | Attending: Urology

## 2022-07-05 ENCOUNTER — Encounter (HOSPITAL_BASED_OUTPATIENT_CLINIC_OR_DEPARTMENT_OTHER): Payer: Self-pay | Admitting: Urology

## 2022-07-05 DIAGNOSIS — I10 Essential (primary) hypertension: Secondary | ICD-10-CM | POA: Diagnosis not present

## 2022-07-05 DIAGNOSIS — C61 Malignant neoplasm of prostate: Secondary | ICD-10-CM | POA: Diagnosis present

## 2022-07-05 DIAGNOSIS — F419 Anxiety disorder, unspecified: Secondary | ICD-10-CM

## 2022-07-05 HISTORY — PX: CYSTOSCOPY: SHX5120

## 2022-07-05 HISTORY — PX: SPACE OAR INSTILLATION: SHX6769

## 2022-07-05 HISTORY — DX: Malignant (primary) neoplasm, unspecified: C80.1

## 2022-07-05 HISTORY — PX: RADIOACTIVE SEED IMPLANT: SHX5150

## 2022-07-05 SURGERY — INSERTION, RADIATION SOURCE, PROSTATE
Anesthesia: General | Site: Prostate

## 2022-07-05 MED ORDER — DEXAMETHASONE SODIUM PHOSPHATE 10 MG/ML IJ SOLN
INTRAMUSCULAR | Status: AC
  Filled 2022-07-05: qty 1

## 2022-07-05 MED ORDER — IOHEXOL 300 MG/ML  SOLN
INTRAMUSCULAR | Status: DC | PRN
  Administered 2022-07-05: 7 mL

## 2022-07-05 MED ORDER — OXYCODONE HCL 5 MG PO TABS: 5.0000 mg | ORAL_TABLET | Freq: Once | ORAL | Status: DC | PRN

## 2022-07-05 MED ORDER — ROCURONIUM BROMIDE 10 MG/ML (PF) SYRINGE
PREFILLED_SYRINGE | INTRAVENOUS | Status: AC
  Filled 2022-07-05: qty 10

## 2022-07-05 MED ORDER — DEXAMETHASONE SODIUM PHOSPHATE 4 MG/ML IJ SOLN
INTRAMUSCULAR | Status: DC | PRN
  Administered 2022-07-05: 10 mg via INTRAVENOUS

## 2022-07-05 MED ORDER — ONDANSETRON HCL 4 MG/2ML IJ SOLN: 4.0000 mg | Freq: Four times a day (QID) | INTRAMUSCULAR | Status: DC | PRN

## 2022-07-05 MED ORDER — ONDANSETRON HCL 4 MG/2ML IJ SOLN
INTRAMUSCULAR | Status: DC | PRN
  Administered 2022-07-05: 4 mg via INTRAVENOUS

## 2022-07-05 MED ORDER — PHENYLEPHRINE HCL (PRESSORS) 10 MG/ML IV SOLN
INTRAVENOUS | Status: DC | PRN
  Administered 2022-07-05 (×4): 80 ug via INTRAVENOUS

## 2022-07-05 MED ORDER — LACTATED RINGERS IV SOLN: INTRAVENOUS | Status: DC

## 2022-07-05 MED ORDER — ROCURONIUM BROMIDE 100 MG/10ML IV SOLN
INTRAVENOUS | Status: DC | PRN
  Administered 2022-07-05: 60 mg via INTRAVENOUS

## 2022-07-05 MED ORDER — SODIUM CHLORIDE (PF) 0.9 % IJ SOLN
INTRAMUSCULAR | Status: DC | PRN
  Administered 2022-07-05: 10 mL

## 2022-07-05 MED ORDER — SUGAMMADEX SODIUM 200 MG/2ML IV SOLN
INTRAVENOUS | Status: DC | PRN
  Administered 2022-07-05: 200 mg via INTRAVENOUS

## 2022-07-05 MED ORDER — STERILE WATER FOR IRRIGATION IR SOLN
Status: DC | PRN
  Administered 2022-07-05: 3 mL

## 2022-07-05 MED ORDER — PROPOFOL 10 MG/ML IV BOLUS
INTRAVENOUS | Status: AC
  Filled 2022-07-05: qty 20

## 2022-07-05 MED ORDER — PHENYLEPHRINE 80 MCG/ML (10ML) SYRINGE FOR IV PUSH (FOR BLOOD PRESSURE SUPPORT)
PREFILLED_SYRINGE | INTRAVENOUS | Status: AC
  Filled 2022-07-05: qty 30

## 2022-07-05 MED ORDER — LIDOCAINE HCL (CARDIAC) PF 100 MG/5ML IV SOSY
PREFILLED_SYRINGE | INTRAVENOUS | Status: DC | PRN
  Administered 2022-07-05: 60 mg via INTRAVENOUS

## 2022-07-05 MED ORDER — CEFAZOLIN SODIUM-DEXTROSE 2-4 GM/100ML-% IV SOLN
2.0000 g | Freq: Once | INTRAVENOUS | Status: AC
  Administered 2022-07-05: 2 g via INTRAVENOUS

## 2022-07-05 MED ORDER — FENTANYL CITRATE (PF) 100 MCG/2ML IJ SOLN: 25.0000 ug | INTRAMUSCULAR | Status: DC | PRN

## 2022-07-05 MED ORDER — ONDANSETRON HCL 4 MG/2ML IJ SOLN
INTRAMUSCULAR | Status: AC
  Filled 2022-07-05: qty 2

## 2022-07-05 MED ORDER — PROPOFOL 10 MG/ML IV BOLUS
INTRAVENOUS | Status: DC | PRN
  Administered 2022-07-05: 150 mg via INTRAVENOUS

## 2022-07-05 MED ORDER — LIDOCAINE HCL (PF) 2 % IJ SOLN
INTRAMUSCULAR | Status: AC
  Filled 2022-07-05: qty 5

## 2022-07-05 MED ORDER — FENTANYL CITRATE (PF) 100 MCG/2ML IJ SOLN
INTRAMUSCULAR | Status: DC | PRN
  Administered 2022-07-05: 100 ug via INTRAVENOUS

## 2022-07-05 MED ORDER — SODIUM CHLORIDE 0.9 % IR SOLN
Status: DC | PRN
  Administered 2022-07-05: 200 mL

## 2022-07-05 MED ORDER — FLEET ENEMA 7-19 GM/118ML RE ENEM: 1.0000 | ENEMA | Freq: Once | RECTAL | Status: DC

## 2022-07-05 MED ORDER — OXYCODONE HCL 5 MG/5ML PO SOLN: 5.0000 mg | Freq: Once | ORAL | Status: DC | PRN

## 2022-07-05 MED ORDER — CEFAZOLIN SODIUM-DEXTROSE 2-4 GM/100ML-% IV SOLN
INTRAVENOUS | Status: AC
  Filled 2022-07-05: qty 100

## 2022-07-05 MED ORDER — FENTANYL CITRATE (PF) 100 MCG/2ML IJ SOLN
INTRAMUSCULAR | Status: AC
  Filled 2022-07-05: qty 2

## 2022-07-05 MED ORDER — DEXMEDETOMIDINE HCL IN NACL 80 MCG/20ML IV SOLN
INTRAVENOUS | Status: DC | PRN
  Administered 2022-07-05: 8 ug via INTRAVENOUS

## 2022-07-05 SURGICAL SUPPLY — 47 items
BAG DRN RND TRDRP ANRFLXCHMBR (UROLOGICAL SUPPLIES) ×2
BAG URINE DRAIN 2000ML AR STRL (UROLOGICAL SUPPLIES) ×3 IMPLANT
BLADE CLIPPER SENSICLIP SURGIC (BLADE) ×3 IMPLANT
BLANKET WARM UPPER BOD BAIR (MISCELLANEOUS) ×3 IMPLANT
Bard QuickLink Cartridges with BrachySource I-125 IMPLANT
CATH FOLEY 2WAY SLVR  5CC 16FR (CATHETERS) ×2
CATH FOLEY 2WAY SLVR 5CC 16FR (CATHETERS) ×3 IMPLANT
CATH ROBINSON RED A/P 16FR (CATHETERS) IMPLANT
CATH ROBINSON RED A/P 20FR (CATHETERS) ×3 IMPLANT
CLOTH BEACON ORANGE TIMEOUT ST (SAFETY) ×3 IMPLANT
CNTNR URN SCR LID CUP LEK RST (MISCELLANEOUS) ×3 IMPLANT
CONT SPEC 4OZ STRL OR WHT (MISCELLANEOUS) ×2
COVER BACK TABLE 60X90IN (DRAPES) ×3 IMPLANT
COVER MAYO STAND STRL (DRAPES) ×3 IMPLANT
DRSG TEGADERM 4X4.75 (GAUZE/BANDAGES/DRESSINGS) ×3 IMPLANT
DRSG TEGADERM 8X12 (GAUZE/BANDAGES/DRESSINGS) ×3 IMPLANT
GAUZE SPONGE 4X4 12PLY STRL (GAUZE/BANDAGES/DRESSINGS) IMPLANT
GEL ULTRASOUND 20GR AQUASONIC (MISCELLANEOUS) ×6 IMPLANT
GLOVE BIO SURGEON STRL SZ 6.5 (GLOVE) IMPLANT
GLOVE BIO SURGEON STRL SZ7.5 (GLOVE) IMPLANT
GLOVE BIO SURGEON STRL SZ8 (GLOVE) ×6 IMPLANT
GLOVE BIOGEL PI IND STRL 6.5 (GLOVE) IMPLANT
GLOVE SURG ORTHO 8.5 STRL (GLOVE) ×3 IMPLANT
GOWN STRL REUS W/TWL XL LVL3 (GOWN DISPOSABLE) ×3 IMPLANT
GRID BRACH TEMP 18GA 2.8X3X.75 (MISCELLANEOUS) ×3 IMPLANT
HOLDER FOLEY CATH W/STRAP (MISCELLANEOUS) ×3 IMPLANT
IMPL SPACEOAR VUE SYSTEM (Spacer) ×3 IMPLANT
IMPLANT SPACEOAR VUE SYSTEM (Spacer) ×2 IMPLANT
IV NS 1000ML (IV SOLUTION) ×2
IV NS 1000ML BAXH (IV SOLUTION) ×3 IMPLANT
KIT TURNOVER CYSTO (KITS) ×3 IMPLANT
NDL BRACHY 18G 5PK (NEEDLE) ×12 IMPLANT
NDL BRACHY 18G SINGLE (NEEDLE) IMPLANT
NDL PK MORGANSTERN STABILIZ (NEEDLE) ×3 IMPLANT
NEEDLE BRACHY 18G 5PK (NEEDLE) ×8 IMPLANT
NEEDLE BRACHY 18G SINGLE (NEEDLE) IMPLANT
NEEDLE PK MORGANSTERN STABILIZ (NEEDLE) ×2 IMPLANT
PACK CYSTO (CUSTOM PROCEDURE TRAY) ×3 IMPLANT
SHEATH ULTRASOUND LF (SHEATH) IMPLANT
SHEATH ULTRASOUND LTX NONSTRL (SHEATH) IMPLANT
SLEEVE SCD COMPRESS KNEE MED (STOCKING) ×3 IMPLANT
SUT BONE WAX W31G (SUTURE) IMPLANT
SYR 10ML LL (SYRINGE) ×3 IMPLANT
SYR CONTROL 10ML LL (SYRINGE) ×3 IMPLANT
TOWEL OR 17X24 6PK STRL BLUE (TOWEL DISPOSABLE) ×3 IMPLANT
UNDERPAD 30X36 HEAVY ABSORB (UNDERPADS AND DIAPERS) ×6 IMPLANT
WATER STERILE IRR 500ML POUR (IV SOLUTION) ×3 IMPLANT

## 2022-07-05 NOTE — Transfer of Care (Signed)
Immediate Anesthesia Transfer of Care Note  Patient: Jared Cantrell  Procedure(s) Performed: RADIOACTIVE SEED IMPLANT/BRACHYTHERAPY IMPLANT (Prostate) SPACE OAR INSTILLATION (Prostate) CYSTOSCOPY FLEXIBLE (Bladder)  Patient Location: PACU  Anesthesia Type:General  Level of Consciousness: awake, alert , oriented, and patient cooperative  Airway & Oxygen Therapy: Patient Spontanous Breathing and Patient connected to face mask oxygen  Post-op Assessment: Report given to RN, Post -op Vital signs reviewed and stable, and Patient moving all extremities X 4  Post vital signs: Reviewed and stable  Last Vitals:  Vitals Value Taken Time  BP 137/89 07/05/22 1248  Temp    Pulse 88 07/05/22 1250  Resp 17 07/05/22 1250  SpO2 100 % 07/05/22 1250  Vitals shown include unvalidated device data.  Last Pain:  Vitals:   07/05/22 0955  TempSrc:   PainSc: 0-No pain      Patients Stated Pain Goal: 5 (07/05/22 0955)  Complications: No notable events documented.

## 2022-07-05 NOTE — Anesthesia Preprocedure Evaluation (Signed)
Anesthesia Evaluation  Patient identified by MRN, date of birth, ID band Patient awake    Reviewed: Allergy & Precautions, H&P , NPO status , Patient's Chart, lab work & pertinent test results  Airway Mallampati: II   Neck ROM: full    Dental   Pulmonary neg pulmonary ROS   breath sounds clear to auscultation       Cardiovascular hypertension,  Rhythm:regular Rate:Normal     Neuro/Psych  PSYCHIATRIC DISORDERS Anxiety        GI/Hepatic   Endo/Other    Renal/GU    Prostate CA    Musculoskeletal   Abdominal   Peds  Hematology   Anesthesia Other Findings   Reproductive/Obstetrics                             Anesthesia Physical Anesthesia Plan  ASA: 2  Anesthesia Plan: General   Post-op Pain Management:    Induction: Intravenous  PONV Risk Score and Plan: 2 and Ondansetron, Dexamethasone, Midazolam and Treatment may vary due to age or medical condition  Airway Management Planned: LMA  Additional Equipment:   Intra-op Plan:   Post-operative Plan: Extubation in OR  Informed Consent: I have reviewed the patients History and Physical, chart, labs and discussed the procedure including the risks, benefits and alternatives for the proposed anesthesia with the patient or authorized representative who has indicated his/her understanding and acceptance.     Dental advisory given  Plan Discussed with: CRNA, Anesthesiologist and Surgeon  Anesthesia Plan Comments:        Anesthesia Quick Evaluation

## 2022-07-05 NOTE — Op Note (Signed)
Preoperative diagnosis: Clinical stage TI C adenocarcinoma the prostate, grade group 5  Postoperative diagnosis: Same   Procedure: I-125 prostate seed implantation, SpaceOAR placement, flexible cystoscopy  Surgeon: Bertram Millard. Jayquon Theiler M.D.  Radiation Oncologist: Margaretmary Dys, M.D.  Anesthesia: Gen.   Indications: Patient  was diagnosed with grade group 5 clinical stage TI C prostate cancer.  He has initiated long-term androgen deprivation therapy and has chosen to have combination radiotherapy.  He underwent consultation my office as well as with Dr. Kathrynn Running. He appeared to understand the advantages disadvantages potential risks of this treatment option. Full informed consent has been obtained.   Technique and findings: Patient was brought the operating room where he had successful induction of general anesthesia. He was placed in dorso-lithotomy position and prepped and draped in usual manner. Appropriate surgical timeout was performed. Radiation oncology department placed a transrectal ultrasound probe anchoring stand. Foley catheter with contrast in the balloon was inserted without difficulty. Anchoring needles were placed within the prostate. Rectal tube was placed. Real-time contouring of the urethra prostate and rectum were performed and the dosing parameters were established. Targeted dose was 110 gray.15  I was then called  to the operating suite suite for placement of the needles. A second timeout was performed. All needle passage was done with real-time transrectal ultrasound guidance with the sagittal plane. A total of 15 needles were placed.  55 active seeds were implanted.  I then proceeded with placement of SpaceOAR by introducing a needle with the bevel angled inferiorly approximately 2 cm superior to the anus. This was angled downward and under direct ultrasound was placed within the space between the prostatic capsule and rectum. This was confirmed with a small amount of sterile  saline injected and this was performed under direct ultrasound. I then attached the SpaceOAR to the needle and injected this in the space between the prostate and rectum with good placement noted. The Foley catheter was removed and flexible cystoscopy failed to show any seeds outside the prostate.  Urothelium of the bladder was normal.  There was no significant prostatic obstruction.  The patient was brought to recovery room in stable condition, having tolerated the procedure well.Marland Kitchen

## 2022-07-05 NOTE — Progress Notes (Signed)
  Radiation Oncology         [phone] ________________________________  Name: Jared Cantrell MRN: [id number]  Date: 07/05/2022  DOB: 1950-07-10       Prostate Seed Implant  WU:JWJX, Jared Blalock, MD  No ref. provider found  DIAGNOSIS:  72 y.o. gentleman with Stage T1c adenocarcinoma of the prostate with Gleason score of 4+5, and PSA of 7.2.   Oncology History  Malignant neoplasm of prostate (HCC)  03/02/2022 Cancer Staging   Staging form: Prostate, AJCC 8th Edition - Clinical stage from 03/02/2022: Stage IIIC (cT1c, cN0, cM0, PSA: 7.2, Grade Group: 5) - Signed by Marcello Fennel, PA-C on 05/30/2022 Histopathologic type: Adenocarcinoma, NOS Stage prefix: Initial diagnosis Prostate specific antigen (PSA) range: Less than 10 Gleason primary pattern: 4 Gleason secondary pattern: 5 Gleason score: 9 Histologic grading system: 5 grade system Number of biopsy cores examined: 12 Number of biopsy cores positive: 6 Location of positive needle core biopsies: One side   04/16/2022 Initial Diagnosis   Malignant neoplasm of prostate       ICD-10-CM   1. Essential hypertension  I10 EKG 12 lead per protocol    EKG 12 lead per protocol      PROCEDURE: Insertion of radioactive I-125 seeds into the prostate gland.  RADIATION DOSE: 110 Gy, boost therapy.  TECHNIQUE: Jared Cantrell was brought to the operating room with the urologist. He was placed in the dorsolithotomy position. He was catheterized and a rectal tube was inserted. The perineum was shaved, prepped and draped. The ultrasound probe was then introduced by me into the rectum to see the prostate gland.  TREATMENT DEVICE: I attached the needle grid to the ultrasound probe stand and anchor needles were placed.  3D PLANNING: The prostate was imaged in 3D using a sagittal sweep of the prostate probe. These images were transferred to the planning computer. There, the prostate, urethra and rectum were defined on each axial  reconstructed image. Then, the software created an optimized 3D plan and a few seed positions were adjusted. The quality of the plan was reviewed using [facility] information for the target and the following two organs at risk:  Urethra and Rectum.  Then the accepted plan was printed and handed off to the radiation therapist.  Under my supervision, the custom loading of the seeds and spacers was carried out using the quick loader.  These pre-loaded needles were then placed into the needle holder.Marland Kitchen  PROSTATE VOLUME STUDY:  Using transrectal ultrasound the volume of the prostate was verified to be 27 cc.  SPECIAL TREATMENT PROCEDURE/SUPERVISION AND HANDLING: The pre-loaded needles were then delivered by the urologist under sagittal guidance. A total of 15 needles were used to deposit 55 seeds in the prostate gland. The individual seed activity was 0.287 mCi.  SpaceOAR:  Yes  COMPLEX SIMULATION: At the end of the procedure, an anterior radiograph of the pelvis was obtained to document seed positioning and count. Cystoscopy was performed by the urologist to check the urethra and bladder.  MICRODOSIMETRY: At the end of the procedure, the patient was emitting 0.06 mR/hr at 1 meter. Accordingly, he was considered safe for hospital discharge.  PLAN: The patient will return to the radiation oncology clinic for post implant CT dosimetry in three weeks.   ________________________________  Jared Cantrell, M.D.

## 2022-07-05 NOTE — Discharge Instructions (Addendum)
Radioactive Seed Implant Home Care Instructions ° ° °Activity:  Rest for the remainder of the day.  Do not drive or operate equipment today.  You may resume normal activities in a few days as instructed by your physician, without risk of harmful radiation exposure to those around you, provided you follow the time and distance precautions on the Radiation Oncology Instruction Sheet. ° ° °Meals: Drink plenty of lipuids and eat light foods, such as gelatin or soup this evening .  You may return to normal meal plan tomorrow. ° °Return To Work: You may return to work as instructed by your physician. ° °Special Instruction:  If any seeds are found, use tweezers to pick up seeds and place in a glass container of any kind and bring to your physician's office. ° °Call your physician if any of these symptoms occur: ° °· Persistent or heavy bleeding °· Urine stream diminishes or stops completely after catheter is removed °· Fever equal to or greater than 101 degrees F °· Cloudy urine with a strong foul odor °· Severe pain ° °You may feel some burning pain and/or hesitancy when you urinate after the catheter is removed.  These symptoms may increase over the next few weeks, but should diminish within forur to six weeks.  Applying moist heat to the lower abdomen or a hot tub bath may help relieve the pain.  If the discomfort becomes severe, please call your physician for additional medications. ° ° ° ° ° °Post Anesthesia Home Care Instructions ° °Activity: °Get plenty of rest for the remainder of the day. A responsible adult should stay with you for 24 hours following the procedure.  °For the next 24 hours, DO NOT: °-Drive a car °-Operate machinery °-Drink alcoholic beverages °-Take any medication unless instructed by your physician °-Make any legal decisions or sign important papers. ° °Meals: °Start with liquid foods such as gelatin or soup. Progress to regular foods as tolerated. Avoid greasy, spicy, heavy foods. If nausea  and/or vomiting occur, drink only clear liquids until the nausea and/or vomiting subsides. Call your physician if vomiting continues. ° °Special Instructions/Symptoms: °Your throat may feel dry or sore from the anesthesia or the breathing tube placed in your throat during surgery. If this causes discomfort, gargle with warm salt water. The discomfort should disappear within 24 hours. ° °

## 2022-07-05 NOTE — Anesthesia Procedure Notes (Signed)
Procedure Name: Intubation Date/Time: 07/05/2022 11:42 AM  Performed by: Earmon Phoenix, CRNAPre-anesthesia Checklist: Patient identified, Patient being monitored, Timeout performed, Emergency Drugs available and Suction available Patient Re-evaluated:Patient Re-evaluated prior to induction Oxygen Delivery Method: Circle System Utilized Preoxygenation: Pre-oxygenation with 100% oxygen Induction Type: IV induction Ventilation: Mask ventilation without difficulty Laryngoscope Size: Miller and 2 Grade View: Grade II Tube type: Oral Tube size: 7.5 mm Number of attempts: 1 Airway Equipment and Method: stylet Placement Confirmation: ETT inserted through vocal cords under direct vision, positive ETCO2 and breath sounds checked- equal and bilateral Secured at: 22 cm Tube secured with: Tape Dental Injury: Teeth and Oropharynx as per pre-operative assessment

## 2022-07-05 NOTE — Interval H&P Note (Signed)
History and Physical Interval Note:  07/05/2022 9:45 AM  Jared Cantrell  has presented today for surgery, with the diagnosis of PROSTATE CANCER.  The various methods of treatment have been discussed with the patient and family. After consideration of risks, benefits and other options for treatment, the patient has consented to  Procedure(s) with comments: RADIOACTIVE SEED IMPLANT/BRACHYTHERAPY IMPLANT (N/A) - 90 MINS SPACE OAR INSTILLATION (N/A) as a surgical intervention.  The patient's history has been reviewed, patient examined, no change in status, stable for surgery.  I have reviewed the patient's chart and labs.  Questions were answered to the patient's satisfaction.     Bertram Millard Katelind Pytel

## 2022-07-06 NOTE — Anesthesia Postprocedure Evaluation (Signed)
Anesthesia Post Note  Patient: Salli Real Alas  Procedure(s) Performed: RADIOACTIVE SEED IMPLANT/BRACHYTHERAPY IMPLANT (Prostate) SPACE OAR INSTILLATION (Prostate) CYSTOSCOPY FLEXIBLE (Bladder)     Patient location during evaluation: PACU Anesthesia Type: General Level of consciousness: awake and alert Pain management: pain level controlled Vital Signs Assessment: post-procedure vital signs reviewed and stable Respiratory status: spontaneous breathing, nonlabored ventilation, respiratory function stable and patient connected to nasal cannula oxygen Cardiovascular status: blood pressure returned to baseline and stable Postop Assessment: no apparent nausea or vomiting Anesthetic complications: no   No notable events documented.  Last Vitals:  Vitals:   07/05/22 1315 07/05/22 1340  BP: 116/79 126/85  Pulse: 76 76  Resp: 19 14  Temp:  (!) 36.3 C  SpO2: 98% 100%    Last Pain:  Vitals:   07/05/22 1340  TempSrc:   PainSc: 0-No pain                 Ruthmary Occhipinti S

## 2022-07-09 ENCOUNTER — Encounter (HOSPITAL_BASED_OUTPATIENT_CLINIC_OR_DEPARTMENT_OTHER): Payer: Self-pay | Admitting: Urology

## 2022-07-18 ENCOUNTER — Telehealth: Payer: Self-pay | Admitting: *Deleted

## 2022-07-18 NOTE — Progress Notes (Signed)
  Radiation Oncology         [phone] ________________________________  Name: Jared Cantrell MRN: [id number]  Date: 07/19/2022  DOB: [birth year]  COMPLEX SIMULATION NOTE  NARRATIVE:  The patient was brought to the CT Simulation planning suite today following prostate seed implantation approximately one month ago.  Identity was confirmed.  All relevant records and images related to the planned course of therapy were reviewed.  Then, the patient was set-up supine.  CT images were obtained.  The CT images were loaded into the planning software.  Then the prostate and rectum were contoured.  Treatment planning then occurred.  The implanted iodine 125 seeds were identified by the physics staff for projection of radiation distribution  I have requested : 3D Simulation  I have requested a DVH of the following structures: Prostate and rectum.    ________________________________  Artist Pais Kathrynn Running, M.D.

## 2022-07-18 NOTE — Progress Notes (Signed)
  Radiation Oncology         [phone] ________________________________  Name: Jared Cantrell MRN: [id number]  Date: 07/19/2022  DOB: September 22, 1950  SIMULATION AND TREATMENT PLANNING NOTE    ICD-10-CM   1. Malignant neoplasm of prostate (HCC)  C61       DIAGNOSIS:  72 y.o. gentleman with Stage T1c adenocarcinoma of the prostate with Gleason score of 4+5, and PSA of 7.2.     NARRATIVE:  The patient was brought to the CT Simulation planning suite.  Identity was confirmed.  All relevant records and images related to the planned course of therapy were reviewed.  The patient freely provided informed written consent to proceed with treatment after reviewing the details related to the planned course of therapy. The consent form was witnessed and verified by the simulation staff.  Then, the patient was set-up in a stable reproducible supine position for radiation therapy.  A vacuum lock pillow device was custom fabricated to position his legs in a reproducible immobilized position.  Then, I performed a urethrogram under sterile conditions to identify the prostatic apex.  CT images were obtained.  Surface markings were placed.  The CT images were loaded into the planning software.  Then the prostate target and avoidance structures including the rectum, bladder, bowel and hips were contoured.  Treatment planning then occurred.  The radiation prescription was entered and confirmed.  A total of one complex treatment devices were fabricated. I have requested : Intensity Modulated Radiotherapy (IMRT) is medically necessary for this case for the following reason:  Rectal sparing.Marland Kitchen  PLAN:  The patient will receive 45 Gy in 25 fractions of 1.8 Gy, to supplement an up-front prostate seed implant boost of 110 Gy to achieve a total nominal dose of 155 Gy.  ________________________________  Artist Pais Kathrynn Running, M.D.

## 2022-07-18 NOTE — Telephone Encounter (Signed)
CALLED  PATIENT TO REMIND OF SIM /POST SEED APPT. FOR 07-19-22- ARRIVAL TIME- 7:45 AM @ [facility], SPOKE WITH PATIENT AND HE IS AWARE OF THIS APPT.

## 2022-07-19 ENCOUNTER — Encounter: Payer: Self-pay | Admitting: Radiation Oncology

## 2022-07-19 ENCOUNTER — Ambulatory Visit
Admission: RE | Admit: 2022-07-19 | Discharge: 2022-07-19 | Disposition: A | Discharge status: Home / Self Care | Payer: Medicare Other | Source: Ambulatory Visit | Attending: Radiation Oncology | Admitting: Radiation Oncology

## 2022-07-19 DIAGNOSIS — C61 Malignant neoplasm of prostate: Secondary | ICD-10-CM | POA: Diagnosis present

## 2022-07-22 NOTE — Progress Notes (Signed)
  Radiation Oncology         [phone] ________________________________  Name: Jared Cantrell MRN: [id number]  Date: 07/19/2022  DOB: 05/31/1950  3D Planning Note   Prostate Brachytherapy Post-Implant Dosimetry  Diagnosis: 72 y.o. gentleman with Stage T1c adenocarcinoma of the prostate with Gleason score of 4+5, and PSA of 7.2.   Narrative: On a previous date, Jared Cantrell returned following prostate seed implantation for post implant planning. He underwent CT scan complex simulation to delineate the three-dimensional structures of the pelvis and demonstrate the radiation distribution.  Since that time, the seed localization, and complex isodose planning with dose volume histograms have now been completed.  Results:   Prostate Coverage - The dose of radiation delivered to the 90% or more of the prostate gland (D90) was 106.01% of the prescription dose. This exceeds our goal of greater than 90%. Rectal Sparing - The volume of rectal tissue receiving the prescription dose or higher was 0.0 cc. This falls under our thresholds tolerance of 1.0 cc.  Impression: The prostate seed implant appears to show adequate target coverage and appropriate rectal sparing.  Plan:  The patient will continue to follow with urology for ongoing PSA determinations. I would anticipate a high likelihood for local tumor control with minimal risk for rectal morbidity.  ________________________________  Artist Pais Kathrynn Running, M.D.

## 2022-07-25 DIAGNOSIS — C61 Malignant neoplasm of prostate: Secondary | ICD-10-CM | POA: Diagnosis present

## 2022-07-26 ENCOUNTER — Ambulatory Visit: Payer: Self-pay | Admitting: Urology

## 2022-07-26 ENCOUNTER — Ambulatory Visit: Payer: Medicare Other | Admitting: Radiation Oncology

## 2022-07-28 ENCOUNTER — Ambulatory Visit: Payer: Medicare Other | Admitting: Radiation Oncology

## 2022-07-31 ENCOUNTER — Ambulatory Visit
Admission: RE | Admit: 2022-07-31 | Discharge: 2022-07-31 | Disposition: A | Discharge status: Home / Self Care | Payer: Medicare Other | Source: Ambulatory Visit | Attending: Radiation Oncology | Admitting: Radiation Oncology

## 2022-07-31 ENCOUNTER — Other Ambulatory Visit: Payer: Self-pay

## 2022-07-31 DIAGNOSIS — C61 Malignant neoplasm of prostate: Secondary | ICD-10-CM | POA: Diagnosis not present

## 2022-07-31 LAB — RAD ONC ARIA SESSION SUMMARY
Course Elapsed Days: 0
Plan Fractions Treated to Date: 1
Plan Prescribed Dose Per Fraction: 1.8 Gy
Plan Total Fractions Prescribed: 25
Plan Total Prescribed Dose: 45 Gy
Reference Point Dosage Given to Date: 1.8 Gy
Reference Point Session Dosage Given: 1.8 Gy
Session Number: 1

## 2022-08-01 ENCOUNTER — Ambulatory Visit: Admission: RE | Admit: 2022-08-01 | Payer: Medicare Other | Source: Ambulatory Visit

## 2022-08-01 ENCOUNTER — Other Ambulatory Visit: Payer: Self-pay

## 2022-08-02 ENCOUNTER — Other Ambulatory Visit: Payer: Self-pay

## 2022-08-02 ENCOUNTER — Ambulatory Visit
Admission: RE | Admit: 2022-08-02 | Discharge: 2022-08-02 | Disposition: A | Discharge status: Home / Self Care | Payer: Medicare Other | Source: Ambulatory Visit | Attending: Radiation Oncology | Admitting: Radiation Oncology

## 2022-08-02 ENCOUNTER — Other Ambulatory Visit: Payer: Self-pay | Admitting: Radiation Oncology

## 2022-08-02 DIAGNOSIS — C61 Malignant neoplasm of prostate: Secondary | ICD-10-CM | POA: Diagnosis not present

## 2022-08-02 LAB — RAD ONC ARIA SESSION SUMMARY
Course Elapsed Days: 2
Plan Fractions Treated to Date: 2
Plan Prescribed Dose Per Fraction: 1.8 Gy
Plan Total Fractions Prescribed: 25
Plan Total Prescribed Dose: 45 Gy
Reference Point Dosage Given to Date: 3.6 Gy
Reference Point Session Dosage Given: 1.8 Gy
Session Number: 2

## 2022-08-02 MED ORDER — TAMSULOSIN HCL 0.4 MG PO CAPS: 0.4000 mg | ORAL_CAPSULE | Freq: Every day | ORAL | 5 refills | Status: DC

## 2022-08-03 ENCOUNTER — Other Ambulatory Visit: Payer: Self-pay

## 2022-08-03 ENCOUNTER — Ambulatory Visit
Admission: RE | Admit: 2022-08-03 | Discharge: 2022-08-03 | Disposition: A | Discharge status: Home / Self Care | Payer: Medicare Other | Source: Ambulatory Visit | Attending: Radiation Oncology | Admitting: Radiation Oncology

## 2022-08-03 DIAGNOSIS — C61 Malignant neoplasm of prostate: Secondary | ICD-10-CM | POA: Diagnosis not present

## 2022-08-03 LAB — RAD ONC ARIA SESSION SUMMARY
Course Elapsed Days: 3
Plan Fractions Treated to Date: 3
Plan Prescribed Dose Per Fraction: 1.8 Gy
Plan Total Fractions Prescribed: 25
Plan Total Prescribed Dose: 45 Gy
Reference Point Dosage Given to Date: 5.4 Gy
Reference Point Session Dosage Given: 1.8 Gy
Session Number: 3

## 2022-08-06 ENCOUNTER — Other Ambulatory Visit: Payer: Self-pay

## 2022-08-06 ENCOUNTER — Ambulatory Visit
Admission: RE | Admit: 2022-08-06 | Discharge: 2022-08-06 | Disposition: A | Discharge status: Home / Self Care | Payer: Medicare Other | Source: Ambulatory Visit | Attending: Radiation Oncology | Admitting: Radiation Oncology

## 2022-08-06 DIAGNOSIS — C61 Malignant neoplasm of prostate: Secondary | ICD-10-CM | POA: Diagnosis not present

## 2022-08-06 LAB — RAD ONC ARIA SESSION SUMMARY
Course Elapsed Days: 6
Plan Fractions Treated to Date: 4
Plan Prescribed Dose Per Fraction: 1.8 Gy
Plan Total Fractions Prescribed: 25
Plan Total Prescribed Dose: 45 Gy
Reference Point Dosage Given to Date: 7.2 Gy
Reference Point Session Dosage Given: 1.8 Gy
Session Number: 4

## 2022-08-07 ENCOUNTER — Ambulatory Visit
Admission: RE | Admit: 2022-08-07 | Discharge: 2022-08-07 | Disposition: A | Discharge status: Home / Self Care | Payer: Medicare Other | Source: Ambulatory Visit | Attending: Radiation Oncology | Admitting: Radiation Oncology

## 2022-08-07 ENCOUNTER — Other Ambulatory Visit: Payer: Self-pay

## 2022-08-07 DIAGNOSIS — C61 Malignant neoplasm of prostate: Secondary | ICD-10-CM | POA: Diagnosis not present

## 2022-08-07 LAB — RAD ONC ARIA SESSION SUMMARY
Course Elapsed Days: 7
Plan Fractions Treated to Date: 5
Plan Prescribed Dose Per Fraction: 1.8 Gy
Plan Total Fractions Prescribed: 25
Plan Total Prescribed Dose: 45 Gy
Reference Point Dosage Given to Date: 9 Gy
Reference Point Session Dosage Given: 1.8 Gy
Session Number: 5

## 2022-08-08 ENCOUNTER — Other Ambulatory Visit: Payer: Self-pay

## 2022-08-08 ENCOUNTER — Ambulatory Visit
Admission: RE | Admit: 2022-08-08 | Discharge: 2022-08-08 | Disposition: A | Discharge status: Home / Self Care | Payer: Medicare Other | Source: Ambulatory Visit | Attending: Radiation Oncology | Admitting: Radiation Oncology

## 2022-08-08 DIAGNOSIS — C61 Malignant neoplasm of prostate: Secondary | ICD-10-CM | POA: Diagnosis not present

## 2022-08-08 LAB — RAD ONC ARIA SESSION SUMMARY
Course Elapsed Days: 8
Plan Fractions Treated to Date: 6
Plan Prescribed Dose Per Fraction: 1.8 Gy
Plan Total Fractions Prescribed: 25
Plan Total Prescribed Dose: 45 Gy
Reference Point Dosage Given to Date: 10.8 Gy
Reference Point Session Dosage Given: 1.8 Gy
Session Number: 6

## 2022-08-09 ENCOUNTER — Other Ambulatory Visit: Payer: Self-pay

## 2022-08-09 ENCOUNTER — Ambulatory Visit
Admission: RE | Admit: 2022-08-09 | Discharge: 2022-08-09 | Disposition: A | Discharge status: Home / Self Care | Payer: Medicare Other | Source: Ambulatory Visit | Attending: Radiation Oncology | Admitting: Radiation Oncology

## 2022-08-09 DIAGNOSIS — C61 Malignant neoplasm of prostate: Secondary | ICD-10-CM | POA: Diagnosis not present

## 2022-08-09 LAB — RAD ONC ARIA SESSION SUMMARY
Course Elapsed Days: 9
Plan Fractions Treated to Date: 7
Plan Prescribed Dose Per Fraction: 1.8 Gy
Plan Total Fractions Prescribed: 25
Plan Total Prescribed Dose: 45 Gy
Reference Point Dosage Given to Date: 12.6 Gy
Reference Point Session Dosage Given: 1.8 Gy
Session Number: 7

## 2022-08-10 ENCOUNTER — Other Ambulatory Visit: Payer: Self-pay

## 2022-08-10 ENCOUNTER — Ambulatory Visit
Admission: RE | Admit: 2022-08-10 | Discharge: 2022-08-10 | Disposition: A | Discharge status: Home / Self Care | Payer: Medicare Other | Source: Ambulatory Visit | Attending: Radiation Oncology | Admitting: Radiation Oncology

## 2022-08-10 DIAGNOSIS — C61 Malignant neoplasm of prostate: Secondary | ICD-10-CM | POA: Diagnosis not present

## 2022-08-10 LAB — RAD ONC ARIA SESSION SUMMARY
Course Elapsed Days: 10
Plan Fractions Treated to Date: 8
Plan Prescribed Dose Per Fraction: 1.8 Gy
Plan Total Fractions Prescribed: 25
Plan Total Prescribed Dose: 45 Gy
Reference Point Dosage Given to Date: 14.4 Gy
Reference Point Session Dosage Given: 1.8 Gy
Session Number: 8

## 2022-08-13 ENCOUNTER — Ambulatory Visit
Admission: RE | Admit: 2022-08-13 | Discharge: 2022-08-13 | Disposition: A | Discharge status: Home / Self Care | Payer: Medicare Other | Source: Ambulatory Visit | Attending: Radiation Oncology | Admitting: Radiation Oncology

## 2022-08-13 ENCOUNTER — Other Ambulatory Visit: Payer: Self-pay

## 2022-08-13 DIAGNOSIS — C61 Malignant neoplasm of prostate: Secondary | ICD-10-CM | POA: Diagnosis not present

## 2022-08-13 LAB — RAD ONC ARIA SESSION SUMMARY
Course Elapsed Days: 13
Plan Fractions Treated to Date: 9
Plan Prescribed Dose Per Fraction: 1.8 Gy
Plan Total Fractions Prescribed: 25
Plan Total Prescribed Dose: 45 Gy
Reference Point Dosage Given to Date: 16.2 Gy
Reference Point Session Dosage Given: 1.8 Gy
Session Number: 9

## 2022-08-14 ENCOUNTER — Ambulatory Visit
Admission: RE | Admit: 2022-08-14 | Discharge: 2022-08-14 | Disposition: A | Discharge status: Home / Self Care | Payer: Medicare Other | Source: Ambulatory Visit | Attending: Radiation Oncology | Admitting: Radiation Oncology

## 2022-08-14 ENCOUNTER — Other Ambulatory Visit: Payer: Self-pay

## 2022-08-14 DIAGNOSIS — C61 Malignant neoplasm of prostate: Secondary | ICD-10-CM | POA: Diagnosis not present

## 2022-08-14 LAB — RAD ONC ARIA SESSION SUMMARY
Course Elapsed Days: 14
Plan Fractions Treated to Date: 10
Plan Prescribed Dose Per Fraction: 1.8 Gy
Plan Total Fractions Prescribed: 25
Plan Total Prescribed Dose: 45 Gy
Reference Point Dosage Given to Date: 18 Gy
Reference Point Session Dosage Given: 1.8 Gy
Session Number: 10

## 2022-08-15 ENCOUNTER — Other Ambulatory Visit: Payer: Self-pay

## 2022-08-15 ENCOUNTER — Ambulatory Visit
Admission: RE | Admit: 2022-08-15 | Discharge: 2022-08-15 | Disposition: A | Discharge status: Home / Self Care | Payer: Medicare Other | Source: Ambulatory Visit | Attending: Radiation Oncology | Admitting: Radiation Oncology

## 2022-08-15 DIAGNOSIS — C61 Malignant neoplasm of prostate: Secondary | ICD-10-CM | POA: Diagnosis not present

## 2022-08-15 LAB — RAD ONC ARIA SESSION SUMMARY
Course Elapsed Days: 15
Plan Fractions Treated to Date: 11
Plan Prescribed Dose Per Fraction: 1.8 Gy
Plan Total Fractions Prescribed: 25
Plan Total Prescribed Dose: 45 Gy
Reference Point Dosage Given to Date: 19.8 Gy
Reference Point Session Dosage Given: 1.8 Gy
Session Number: 11

## 2022-08-16 ENCOUNTER — Ambulatory Visit
Admission: RE | Admit: 2022-08-16 | Discharge: 2022-08-16 | Disposition: A | Discharge status: Home / Self Care | Payer: Medicare Other | Source: Ambulatory Visit | Attending: Radiation Oncology | Admitting: Radiation Oncology

## 2022-08-16 ENCOUNTER — Other Ambulatory Visit: Payer: Self-pay

## 2022-08-16 DIAGNOSIS — C61 Malignant neoplasm of prostate: Secondary | ICD-10-CM | POA: Diagnosis not present

## 2022-08-16 LAB — RAD ONC ARIA SESSION SUMMARY
Course Elapsed Days: 16
Plan Fractions Treated to Date: 12
Plan Prescribed Dose Per Fraction: 1.8 Gy
Plan Total Fractions Prescribed: 25
Plan Total Prescribed Dose: 45 Gy
Reference Point Dosage Given to Date: 21.6 Gy
Reference Point Session Dosage Given: 1.8 Gy
Session Number: 12

## 2022-08-17 ENCOUNTER — Other Ambulatory Visit: Payer: Self-pay

## 2022-08-17 ENCOUNTER — Ambulatory Visit
Admission: RE | Admit: 2022-08-17 | Discharge: 2022-08-17 | Disposition: A | Discharge status: Home / Self Care | Payer: Medicare Other | Source: Ambulatory Visit | Attending: Radiation Oncology | Admitting: Radiation Oncology

## 2022-08-17 DIAGNOSIS — C61 Malignant neoplasm of prostate: Secondary | ICD-10-CM | POA: Diagnosis not present

## 2022-08-17 LAB — RAD ONC ARIA SESSION SUMMARY
Course Elapsed Days: 17
Plan Fractions Treated to Date: 13
Plan Prescribed Dose Per Fraction: 1.8 Gy
Plan Total Fractions Prescribed: 25
Plan Total Prescribed Dose: 45 Gy
Reference Point Dosage Given to Date: 23.4 Gy
Reference Point Session Dosage Given: 1.8 Gy
Session Number: 13

## 2022-08-17 LAB — LAB REPORT - SCANNED: PSA, Total: 0.097

## 2022-08-20 ENCOUNTER — Other Ambulatory Visit: Payer: Self-pay

## 2022-08-20 ENCOUNTER — Ambulatory Visit
Admission: RE | Admit: 2022-08-20 | Discharge: 2022-08-20 | Disposition: A | Discharge status: Home / Self Care | Payer: Medicare Other | Source: Ambulatory Visit | Attending: Radiation Oncology | Admitting: Radiation Oncology

## 2022-08-20 DIAGNOSIS — C61 Malignant neoplasm of prostate: Secondary | ICD-10-CM | POA: Insufficient documentation

## 2022-08-20 LAB — RAD ONC ARIA SESSION SUMMARY
Course Elapsed Days: 20
Plan Fractions Treated to Date: 14
Plan Prescribed Dose Per Fraction: 1.8 Gy
Plan Total Fractions Prescribed: 25
Plan Total Prescribed Dose: 45 Gy
Reference Point Dosage Given to Date: 25.2 Gy
Reference Point Session Dosage Given: 1.8 Gy
Session Number: 14

## 2022-08-21 ENCOUNTER — Ambulatory Visit
Admission: RE | Admit: 2022-08-21 | Discharge: 2022-08-21 | Disposition: A | Discharge status: Home / Self Care | Payer: Medicare Other | Source: Ambulatory Visit | Attending: Radiation Oncology | Admitting: Radiation Oncology

## 2022-08-21 ENCOUNTER — Other Ambulatory Visit: Payer: Self-pay

## 2022-08-21 DIAGNOSIS — C61 Malignant neoplasm of prostate: Secondary | ICD-10-CM | POA: Diagnosis not present

## 2022-08-21 LAB — RAD ONC ARIA SESSION SUMMARY
Course Elapsed Days: 21
Plan Fractions Treated to Date: 15
Plan Prescribed Dose Per Fraction: 1.8 Gy
Plan Total Fractions Prescribed: 25
Plan Total Prescribed Dose: 45 Gy
Reference Point Dosage Given to Date: 27 Gy
Reference Point Session Dosage Given: 1.8 Gy
Session Number: 15

## 2022-08-22 ENCOUNTER — Other Ambulatory Visit: Payer: Self-pay

## 2022-08-22 ENCOUNTER — Ambulatory Visit
Admission: RE | Admit: 2022-08-22 | Discharge: 2022-08-22 | Disposition: A | Discharge status: Home / Self Care | Payer: Medicare Other | Source: Ambulatory Visit | Attending: Radiation Oncology | Admitting: Radiation Oncology

## 2022-08-22 DIAGNOSIS — C61 Malignant neoplasm of prostate: Secondary | ICD-10-CM | POA: Diagnosis not present

## 2022-08-22 LAB — RAD ONC ARIA SESSION SUMMARY
Course Elapsed Days: 22
Plan Fractions Treated to Date: 16
Plan Prescribed Dose Per Fraction: 1.8 Gy
Plan Total Fractions Prescribed: 25
Plan Total Prescribed Dose: 45 Gy
Reference Point Dosage Given to Date: 28.8 Gy
Reference Point Session Dosage Given: 1.8 Gy
Session Number: 16

## 2022-08-24 ENCOUNTER — Ambulatory Visit: Payer: Medicare Other

## 2022-08-27 ENCOUNTER — Other Ambulatory Visit: Payer: Self-pay

## 2022-08-27 ENCOUNTER — Ambulatory Visit
Admission: RE | Admit: 2022-08-27 | Discharge: 2022-08-27 | Disposition: A | Discharge status: Home / Self Care | Payer: Medicare Other | Source: Ambulatory Visit | Attending: Radiation Oncology | Admitting: Radiation Oncology

## 2022-08-27 DIAGNOSIS — C61 Malignant neoplasm of prostate: Secondary | ICD-10-CM | POA: Diagnosis not present

## 2022-08-27 LAB — RAD ONC ARIA SESSION SUMMARY
Course Elapsed Days: 27
Plan Fractions Treated to Date: 17
Plan Prescribed Dose Per Fraction: 1.8 Gy
Plan Total Fractions Prescribed: 25
Plan Total Prescribed Dose: 45 Gy
Reference Point Dosage Given to Date: 30.6 Gy
Reference Point Session Dosage Given: 1.8 Gy
Session Number: 17

## 2022-08-28 ENCOUNTER — Ambulatory Visit
Admission: RE | Admit: 2022-08-28 | Discharge: 2022-08-28 | Disposition: A | Discharge status: Home / Self Care | Payer: Medicare Other | Source: Ambulatory Visit | Attending: Radiation Oncology | Admitting: Radiation Oncology

## 2022-08-28 ENCOUNTER — Other Ambulatory Visit: Payer: Self-pay

## 2022-08-28 DIAGNOSIS — C61 Malignant neoplasm of prostate: Secondary | ICD-10-CM | POA: Diagnosis not present

## 2022-08-28 LAB — RAD ONC ARIA SESSION SUMMARY
Course Elapsed Days: 28
Plan Fractions Treated to Date: 18
Plan Prescribed Dose Per Fraction: 1.8 Gy
Plan Total Fractions Prescribed: 25
Plan Total Prescribed Dose: 45 Gy
Reference Point Dosage Given to Date: 32.4 Gy
Reference Point Session Dosage Given: 1.8 Gy
Session Number: 18

## 2022-08-29 ENCOUNTER — Other Ambulatory Visit: Payer: Self-pay

## 2022-08-29 ENCOUNTER — Ambulatory Visit
Admission: RE | Admit: 2022-08-29 | Discharge: 2022-08-29 | Disposition: A | Discharge status: Home / Self Care | Payer: Medicare Other | Source: Ambulatory Visit | Attending: Radiation Oncology | Admitting: Radiation Oncology

## 2022-08-29 DIAGNOSIS — C61 Malignant neoplasm of prostate: Secondary | ICD-10-CM | POA: Diagnosis not present

## 2022-08-29 LAB — RAD ONC ARIA SESSION SUMMARY
Course Elapsed Days: 29
Plan Fractions Treated to Date: 19
Plan Prescribed Dose Per Fraction: 1.8 Gy
Plan Total Fractions Prescribed: 25
Plan Total Prescribed Dose: 45 Gy
Reference Point Dosage Given to Date: 34.2 Gy
Reference Point Session Dosage Given: 1.8 Gy
Session Number: 19

## 2022-08-30 ENCOUNTER — Other Ambulatory Visit: Payer: Self-pay

## 2022-08-30 ENCOUNTER — Ambulatory Visit
Admission: RE | Admit: 2022-08-30 | Discharge: 2022-08-30 | Disposition: A | Discharge status: Home / Self Care | Payer: Medicare Other | Source: Ambulatory Visit | Attending: Radiation Oncology | Admitting: Radiation Oncology

## 2022-08-30 DIAGNOSIS — C61 Malignant neoplasm of prostate: Secondary | ICD-10-CM | POA: Diagnosis not present

## 2022-08-30 LAB — RAD ONC ARIA SESSION SUMMARY
Course Elapsed Days: 30
Plan Fractions Treated to Date: 20
Plan Prescribed Dose Per Fraction: 1.8 Gy
Plan Total Fractions Prescribed: 25
Plan Total Prescribed Dose: 45 Gy
Reference Point Dosage Given to Date: 36 Gy
Reference Point Session Dosage Given: 1.8 Gy
Session Number: 20

## 2022-08-31 ENCOUNTER — Ambulatory Visit: Admission: RE | Admit: 2022-08-31 | Payer: Medicare Other | Source: Ambulatory Visit

## 2022-08-31 ENCOUNTER — Ambulatory Visit
Admission: RE | Admit: 2022-08-31 | Discharge: 2022-08-31 | Disposition: A | Discharge status: Home / Self Care | Payer: Medicare Other | Source: Ambulatory Visit | Attending: Radiation Oncology | Admitting: Radiation Oncology

## 2022-08-31 ENCOUNTER — Other Ambulatory Visit: Payer: Self-pay

## 2022-08-31 DIAGNOSIS — C61 Malignant neoplasm of prostate: Secondary | ICD-10-CM | POA: Diagnosis not present

## 2022-08-31 LAB — RAD ONC ARIA SESSION SUMMARY
Course Elapsed Days: 31
Plan Fractions Treated to Date: 21
Plan Prescribed Dose Per Fraction: 1.8 Gy
Plan Total Fractions Prescribed: 25
Plan Total Prescribed Dose: 45 Gy
Reference Point Dosage Given to Date: 37.8 Gy
Reference Point Session Dosage Given: 1.8 Gy
Session Number: 21

## 2022-09-03 ENCOUNTER — Other Ambulatory Visit: Payer: Self-pay

## 2022-09-03 ENCOUNTER — Ambulatory Visit
Admission: RE | Admit: 2022-09-03 | Discharge: 2022-09-03 | Disposition: A | Discharge status: Home / Self Care | Payer: Medicare Other | Source: Ambulatory Visit | Attending: Radiation Oncology | Admitting: Radiation Oncology

## 2022-09-03 DIAGNOSIS — C61 Malignant neoplasm of prostate: Secondary | ICD-10-CM | POA: Diagnosis not present

## 2022-09-03 LAB — RAD ONC ARIA SESSION SUMMARY
Course Elapsed Days: 34
Plan Fractions Treated to Date: 22
Plan Prescribed Dose Per Fraction: 1.8 Gy
Plan Total Fractions Prescribed: 25
Plan Total Prescribed Dose: 45 Gy
Reference Point Dosage Given to Date: 39.6 Gy
Reference Point Session Dosage Given: 1.8 Gy
Session Number: 22

## 2022-09-04 ENCOUNTER — Other Ambulatory Visit: Payer: Self-pay

## 2022-09-04 ENCOUNTER — Ambulatory Visit
Admission: RE | Admit: 2022-09-04 | Discharge: 2022-09-04 | Disposition: A | Discharge status: Home / Self Care | Payer: Medicare Other | Source: Ambulatory Visit | Attending: Radiation Oncology | Admitting: Radiation Oncology

## 2022-09-04 ENCOUNTER — Ambulatory Visit: Payer: Medicare Other

## 2022-09-04 DIAGNOSIS — C61 Malignant neoplasm of prostate: Secondary | ICD-10-CM | POA: Diagnosis not present

## 2022-09-04 LAB — RAD ONC ARIA SESSION SUMMARY
Course Elapsed Days: 35
Plan Fractions Treated to Date: 23
Plan Prescribed Dose Per Fraction: 1.8 Gy
Plan Total Fractions Prescribed: 25
Plan Total Prescribed Dose: 45 Gy
Reference Point Dosage Given to Date: 41.4 Gy
Reference Point Session Dosage Given: 1.8 Gy
Session Number: 23

## 2022-09-05 ENCOUNTER — Ambulatory Visit: Payer: Medicare Other

## 2022-09-05 ENCOUNTER — Other Ambulatory Visit: Payer: Self-pay

## 2022-09-05 ENCOUNTER — Ambulatory Visit
Admission: RE | Admit: 2022-09-05 | Discharge: 2022-09-05 | Disposition: A | Discharge status: Home / Self Care | Payer: Medicare Other | Source: Ambulatory Visit | Attending: Radiation Oncology | Admitting: Radiation Oncology

## 2022-09-05 DIAGNOSIS — C61 Malignant neoplasm of prostate: Secondary | ICD-10-CM | POA: Diagnosis not present

## 2022-09-05 LAB — RAD ONC ARIA SESSION SUMMARY
Course Elapsed Days: 36
Plan Fractions Treated to Date: 24
Plan Prescribed Dose Per Fraction: 1.8 Gy
Plan Total Fractions Prescribed: 25
Plan Total Prescribed Dose: 45 Gy
Reference Point Dosage Given to Date: 43.2 Gy
Reference Point Session Dosage Given: 1.8 Gy
Session Number: 24

## 2022-09-06 ENCOUNTER — Other Ambulatory Visit: Payer: Self-pay

## 2022-09-06 ENCOUNTER — Ambulatory Visit
Admission: RE | Admit: 2022-09-06 | Discharge: 2022-09-06 | Disposition: A | Discharge status: Home / Self Care | Payer: Medicare Other | Source: Ambulatory Visit | Attending: Radiation Oncology | Admitting: Radiation Oncology

## 2022-09-06 DIAGNOSIS — C61 Malignant neoplasm of prostate: Secondary | ICD-10-CM | POA: Diagnosis not present

## 2022-09-06 LAB — RAD ONC ARIA SESSION SUMMARY
Course Elapsed Days: 37
Plan Fractions Treated to Date: 25
Plan Prescribed Dose Per Fraction: 1.8 Gy
Plan Total Fractions Prescribed: 25
Plan Total Prescribed Dose: 45 Gy
Reference Point Dosage Given to Date: 45 Gy
Reference Point Session Dosage Given: 1.8 Gy
Session Number: 25

## 2022-09-08 NOTE — Radiation Completion Notes (Addendum)
  Radiation Oncology         [phone] ________________________________  Name: Jared Cantrell MRN: [id number]  Date: 09/06/2022  DOB: [birth year]  Referring Physician: Marcine Matar, M.D. Date of Service: 2022-09-08 Radiation Oncologist: Margaretmary Bayley, M.D. Jared Cantrell Navos     RADIATION ONCOLOGY END OF TREATMENT NOTE     Diagnosis:  72 y.o. gentleman with Stage T1c adenocarcinoma of the prostate with Gleason score of 4+5, and PSA of 7.2.  Intent: Curative     ==========DELIVERED PLANS==========  First Treatment Date: 2022-07-31 - Last Treatment Date: 2022-09-06   Plan Name: Prostate_Pelv Site: Prostate Technique: IMRT Mode: Photon Dose Per Fraction: 1.8 Gy Prescribed Dose (Delivered / Prescribed): 45 Gy / 45 Gy Prescribed Fxs (Delivered / Prescribed): 25 / 25   Plan Name: Prostate Seed Implant Site: Prostate Technique: Radioactive Seed Implant I-125 Mode: Brachytherapy Dose Per Fraction: 110 Gy Prescribed Dose (Delivered / Prescribed): 110 Gy / 110 Gy Prescribed Fxs (Delivered / Prescribed): 1 / 1     ==========ON TREATMENT VISIT DATES========== 2022-08-02, 2022-08-10, 2022-08-16, 2022-08-22, 2022-08-31    See weekly On Treatment Notes in Epic for details.  He tolerated the radiation treatments fairly well with increased frequency, urgency and nocturia that was managed with Flomax daily.  He denied any abdominal pain or bowel issues.  The patient will receive a call in about one month from the radiation oncology department. He will continue follow up with his urologist, Dr. Retta Diones, as well.  ------------------------------------------------   Margaretmary Dys, MD [facility]  Radiation Oncology Direct Dial: [phone]  Fax: [phone] [email or web address]  Skype  LinkedIn

## 2022-09-13 NOTE — Progress Notes (Signed)
Patient was a RadOnc Consult on 04/16/22 for his stage T1c adenocarcinoma of the prostate with Gleason score of 4+5, and PSA of 7.2. Patient proceed with treatment recommendations of LT-ADT (started on Orgovyx 3/15 and then transitioned to Lupron on 4/10), brachy boost, and 5 weeks of external beam radiation and had his final radiation treatment on 09/06/22.   Patient is scheduled for a post treatment nurse call on 10/16/22 and has a 3 month post treatment follow up with Dr. Berneice Heinrich on 12/11/22 at 8:15 am.   RN spoke with patient and informed him of this appointment, and provided education on post treatment PSA monitoring.  Per schedulers at [facility] they will obtain PSA that date, and patient will be due for an additional ADT injection.  Pt aware and verbalizes understanding.    No additional needs at this time.

## 2022-10-16 ENCOUNTER — Ambulatory Visit
Admission: RE | Admit: 2022-10-16 | Discharge: 2022-10-16 | Disposition: A | Discharge status: Home / Self Care | Payer: Medicare Other | Source: Ambulatory Visit | Attending: Internal Medicine | Admitting: Internal Medicine

## 2022-10-16 NOTE — Progress Notes (Signed)
  Radiation Oncology         [phone] ________________________________  Name: Jared Cantrell MRN: [id number]  Date of Service: 10/16/2022  DOB: 1950-12-05  Post Treatment Telephone Note  Diagnosis:  72 y.o. gentleman with Stage T1c adenocarcinoma of the prostate with Gleason score of 4+5, and PSA of 7.2. (as documented in provider EOT note)  Pre Treatment IPSS Score: 11 (as documented in the provider consult note)  The patient was not available for call today. Voicemail left.  Patient has a scheduled follow up visit with his urologist, Dr. Berneice Heinrich, on 12/11/2022 for ongoing surveillance. He was counseled that PSA levels will be drawn in the urology office, and was reassured that additional time is expected to improve bowel and bladder symptoms. He was encouraged to call back with concerns or questions regarding radiation.    [name], LPN

## 2023-01-09 ENCOUNTER — Encounter: Payer: Self-pay | Admitting: Internal Medicine

## 2023-01-09 ENCOUNTER — Ambulatory Visit: Payer: Medicare Other | Admitting: Internal Medicine

## 2023-01-09 VITALS — BP 122/78 | HR 90 | Temp 97.8°F | Ht 70.0 in | Wt 181.0 lb

## 2023-01-09 DIAGNOSIS — I10 Essential (primary) hypertension: Secondary | ICD-10-CM | POA: Diagnosis not present

## 2023-01-09 DIAGNOSIS — E559 Vitamin D deficiency, unspecified: Secondary | ICD-10-CM

## 2023-01-09 DIAGNOSIS — R739 Hyperglycemia, unspecified: Secondary | ICD-10-CM | POA: Diagnosis not present

## 2023-01-09 DIAGNOSIS — C61 Malignant neoplasm of prostate: Secondary | ICD-10-CM | POA: Diagnosis not present

## 2023-01-09 DIAGNOSIS — E538 Deficiency of other specified B group vitamins: Secondary | ICD-10-CM | POA: Diagnosis not present

## 2023-01-09 DIAGNOSIS — R972 Elevated prostate specific antigen [PSA]: Secondary | ICD-10-CM | POA: Diagnosis not present

## 2023-01-09 DIAGNOSIS — E7800 Pure hypercholesterolemia, unspecified: Secondary | ICD-10-CM

## 2023-01-09 DIAGNOSIS — N400 Enlarged prostate without lower urinary tract symptoms: Secondary | ICD-10-CM

## 2023-01-09 LAB — CBC WITH DIFFERENTIAL/PLATELET
Basophils Absolute: 0 10*3/uL (ref 0.0–0.1)
Basophils Relative: 0.5 % (ref 0.0–3.0)
Eosinophils Absolute: 0.2 10*3/uL (ref 0.0–0.7)
Eosinophils Relative: 4.5 % (ref 0.0–5.0)
HCT: 35.6 % — ABNORMAL LOW (ref 39.0–52.0)
Hemoglobin: 11.3 g/dL — ABNORMAL LOW (ref 13.0–17.0)
Lymphocytes Relative: 8.2 % — ABNORMAL LOW (ref 12.0–46.0)
Lymphs Abs: 0.4 10*3/uL — ABNORMAL LOW (ref 0.7–4.0)
MCHC: 31.8 g/dL (ref 30.0–36.0)
MCV: 80.5 fL (ref 78.0–100.0)
Monocytes Absolute: 0.7 10*3/uL (ref 0.1–1.0)
Monocytes Relative: 13.4 % — ABNORMAL HIGH (ref 3.0–12.0)
Neutro Abs: 3.7 10*3/uL (ref 1.4–7.7)
Neutrophils Relative %: 73.4 % (ref 43.0–77.0)
Platelets: 297 10*3/uL (ref 150.0–400.0)
RBC: 4.42 Mil/uL (ref 4.22–5.81)
RDW: 15.6 % — ABNORMAL HIGH (ref 11.5–15.5)
WBC: 5 10*3/uL (ref 4.0–10.5)

## 2023-01-09 LAB — LIPID PANEL
Cholesterol: 174 mg/dL (ref 0–200)
HDL: 49.3 mg/dL (ref >39.00)
LDL Cholesterol: 109 mg/dL — ABNORMAL HIGH (ref 0–99)
NonHDL: 124.67
Total CHOL/HDL Ratio: 4
Triglycerides: 78 mg/dL (ref 0.0–149.0)
VLDL: 15.6 mg/dL (ref 0.0–40.0)

## 2023-01-09 LAB — BASIC METABOLIC PANEL
BUN: 12 mg/dL (ref 6–23)
CO2: 31 meq/L (ref 19–32)
Calcium: 10.7 mg/dL — ABNORMAL HIGH (ref 8.4–10.5)
Chloride: 102 meq/L (ref 96–112)
Creatinine, Ser: 0.91 mg/dL (ref 0.40–1.50)
GFR: 85.69 mL/min (ref >60.00)
Glucose, Bld: 110 mg/dL — ABNORMAL HIGH (ref 70–99)
Potassium: 4.7 meq/L (ref 3.5–5.1)
Sodium: 137 meq/L (ref 135–145)

## 2023-01-09 LAB — HEPATIC FUNCTION PANEL
ALT: 14 U/L (ref 0–53)
AST: 14 U/L (ref 0–37)
Albumin: 4.1 g/dL (ref 3.5–5.2)
Alkaline Phosphatase: 112 U/L (ref 39–117)
Bilirubin, Direct: 0.1 mg/dL (ref 0.0–0.3)
Total Bilirubin: 0.5 mg/dL (ref 0.2–1.2)
Total Protein: 7 g/dL (ref 6.0–8.3)

## 2023-01-09 LAB — HEMOGLOBIN A1C: Hgb A1c MFr Bld: 6.8 % — ABNORMAL HIGH (ref 4.6–6.5)

## 2023-01-09 LAB — VITAMIN B12: Vitamin B-12: 197 pg/mL — ABNORMAL LOW (ref 211–911)

## 2023-01-09 LAB — VITAMIN D 25 HYDROXY (VIT D DEFICIENCY, FRACTURES): VITD: 46.69 ng/mL (ref 30.00–100.00)

## 2023-01-09 LAB — TSH: TSH: 0.86 u[IU]/mL (ref 0.35–5.50)

## 2023-01-09 LAB — PSA: PSA: 0 ng/mL — ABNORMAL LOW (ref 0.10–4.00)

## 2023-01-09 MED ORDER — ATORVASTATIN CALCIUM 40 MG PO TABS: 40.0000 mg | ORAL_TABLET | Freq: Every day | ORAL | 3 refills | Status: DC

## 2023-01-09 MED ORDER — LOSARTAN POTASSIUM 100 MG PO TABS: 100.0000 mg | ORAL_TABLET | Freq: Every day | ORAL | 3 refills | Status: DC

## 2023-01-09 NOTE — Patient Instructions (Signed)

## 2023-01-09 NOTE — Assessment & Plan Note (Addendum)
Lab Results  Component Value Date   LDLCALC 113 (H) 01/04/2022   Uncontrolled now on lipitor 40 mg, pt to continue current statin and check lab, declines card cT score for now

## 2023-01-09 NOTE — Assessment & Plan Note (Signed)
Improved, stable on flomax 1 qd

## 2023-01-09 NOTE — Assessment & Plan Note (Signed)
BP Readings from Last 3 Encounters:  01/09/23 122/78  07/05/22 126/85  04/16/22 130/85   Stable, pt to continue medical treatment losartan 100 qd

## 2023-01-09 NOTE — Progress Notes (Signed)
Patient ID: Jared Cantrell, male   DOB: [birth year], 72 y.o.   MRN: [id number]         Chief Complaint:: yearly exam hx of prostate ca, htn, hld,         HPI:  Jared Cantrell is a 72 y.o. male here for above, has recent dx prostate ca s/p XRT and seed XRT due agressive type;  PSA now down near zero.  Denies urinary symptoms such as dysuria, frequency, urgency, flank pain, hematuria or n/v, fever, chills, now flomax daily. Pt denies chest pain, increased sob or doe, wheezing, orthopnea, PND, increased LE swelling, palpitations, dizziness or syncope.   Pt denies polydipsia, polyuria, or new focal neuro s/s. Pt denies fever, wt loss, night sweats, loss of appetite, or other constitutional symptoms  Denies worsening reflux, abd pain, dysphagia, n/v, or blood except for constipation he attributes to his 4 times tx over 2 yrs of anti androgen therapy. Has some hot flashes but unusual.     Wt Readings from Last 3 Encounters:  01/09/23 181 lb (82.1 kg)  07/05/22 179 lb 9.6 oz (81.5 kg)  05/31/22 179 lb (81.2 kg)   BP Readings from Last 3 Encounters:  01/09/23 122/78  07/05/22 126/85  04/16/22 130/85   Immunization History  Administered Date(s) Administered   Fluad Quad(high Dose 65+) 12/19/2018, 01/04/2022, 12/09/2022   Influenza, High Dose Seasonal PF 01/09/2018   Influenza,inj,Quad PF,6+ Mos 01/08/2017   Influenza-Unspecified 12/14/2019   PFIZER(Purple Top)SARS-COV-2 Vaccination 04/05/2019, 04/28/2019, 12/14/2019, 07/06/2020   Pneumococcal Conjugate-13 01/23/2018   Pneumococcal Polysaccharide-23 03/25/2019   Td 05/12/2008   Tdap 01/13/2019   Zoster Recombinant(Shingrix) 07/21/2019, 09/21/2019   Health Maintenance Due  Topic Date Due   Medicare Annual Wellness (AWV)  11/21/2021      Past Medical History:  Diagnosis Date   ANXIETY 05/12/2008   BENIGN PROSTATIC HYPERTROPHY 05/12/2008   Cancer (HCC)    ERECTILE DYSFUNCTION, 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 Retta Diones   RADIOACTIVE SEED IMPLANT N/A 07/05/2022   Procedure: RADIOACTIVE SEED IMPLANT/BRACHYTHERAPY IMPLANT;  Surgeon: Marcine Matar, MD;  Location: [facility];  Service: Urology;  Laterality: N/A;  90 MINS   SPACE OAR INSTILLATION N/A 07/05/2022   Procedure: SPACE OAR INSTILLATION;  Surgeon: Marcine Matar, MD;  Location: [facility];  Service: Urology;  Laterality: N/A;   WISDOM TOOTH EXTRACTION      reports that he has never smoked. He has never used smokeless tobacco. He reports current alcohol use of about 2.0 standard drinks of alcohol per week. He reports that he does not use drugs. family history includes Arthritis in his mother; Cancer in his brother; Diabetes in his mother; Hypertension in his brother, mother, and sister; Stroke in his sister. No Known Allergies Current Outpatient Medications on File Prior to Visit  Medication Sig Dispense Refill   amitriptyline (ELAVIL) 50 MG tablet Take 1 tablet (50 mg total) by mouth at bedtime. 90 tablet 3   aspirin 81 MG EC tablet Take 81 mg by mouth daily.     tamsulosin (FLOMAX) 0.4 MG CAPS capsule Take 1 capsule (0.4 mg total) by mouth daily after supper. 30 capsule  5   No current facility-administered medications on file prior to visit.        ROS:  All others reviewed and negative.  Objective        PE:  BP 122/78 (BP Location: Right Arm, Patient Position: Sitting, Cuff Size: Normal)   Pulse 90   Temp 97.8 F (36.6 C) (Oral)   Ht 5\' 10"  (1.778 m)   Wt 181 lb (82.1 kg)   SpO2 98%   BMI 25.97 kg/m                 Constitutional: Pt appears in NAD               HENT: Head: NCAT.                 Right Ear: External ear normal.                 Left Ear: External ear normal.                Eyes: . Pupils are equal, round, and reactive to light. Conjunctivae and EOM are normal               Nose: without d/c or deformity               Neck: Neck supple. Gross normal ROM               Cardiovascular: Normal rate and regular rhythm.                 Pulmonary/Chest: Effort normal and breath sounds without rales or wheezing.                Abd:  Soft, NT, ND, + BS, no organomegaly               Neurological: Pt is alert. At baseline orientation, motor grossly intact               Skin: Skin is warm. No rashes, no other new lesions, LE edema - none               Psychiatric: Pt behavior is normal without agitation   Micro: none  Cardiac tracings I have personally interpreted today:  none  Pertinent Radiological findings (summarize): none   Lab Results  Component Value Date   WBC 3.8 (L) 01/04/2022   HGB 15.5 01/04/2022   HCT 48.8 01/04/2022   PLT 219.0 01/04/2022   GLUCOSE 96 01/04/2022   CHOL 192 01/04/2022   TRIG 98.0 01/04/2022   HDL 59.20 01/04/2022   LDLCALC 113 (H) 01/04/2022   ALT 33 01/04/2022   AST 22 01/04/2022   NA 136 01/04/2022   K 4.6 01/04/2022   CL 101 01/04/2022   CREATININE 0.97 01/04/2022   BUN 9 01/04/2022   CO2 31 01/04/2022   TSH 1.04 01/04/2022   PSA 7.54 (H) 01/04/2022   HGBA1C 6.4 01/04/2022   Assessment/Plan:  Jared Cantrell is a 72 y.o. Black or African American [2] male with  has a past medical history of ANXIETY (05/12/2008), BENIGN PROSTATIC HYPERTROPHY (05/12/2008), Cancer (HCC), ERECTILE DYSFUNCTION, ORGANIC (04/04/2010), HYPERLIPIDEMIA (05/12/2008), HYPERTENSION (04/04/2010), and HYPOGONADISM, MALE (05/12/2008).  Malignant neoplasm of prostate (HCC) Pt requests psa  Vitamin D deficiency Last vitamin D Lab Results  Component Value Date   VD25OH 44.49 01/04/2022   Stable, cont oral  replacement   Hyperlipidemia Lab Results  Component Value Date   LDLCALC 113 (H)  01/04/2022   Uncontrolled now on lipitor 40 mg, pt to continue current statin and check lab, declines card cT score for now   Essential hypertension BP Readings from Last 3 Encounters:  01/09/23 122/78  07/05/22 126/85  04/16/22 130/85   Stable, pt to continue medical treatment losartan 100 qd   BPH (benign prostatic hyperplasia) Improved, stable on flomax 1 qd  Followup: Return in about 6 months (around 07/09/2023).  Oliver Barre, MD 01/09/2023 10:13 AM [facility]

## 2023-01-09 NOTE — Assessment & Plan Note (Signed)
Pt requests psa

## 2023-01-09 NOTE — Assessment & Plan Note (Signed)
Last vitamin D Lab Results  Component Value Date   VD25OH 44.49 01/04/2022   Stable, cont oral replacement

## 2023-01-10 ENCOUNTER — Encounter: Payer: Self-pay | Admitting: Internal Medicine

## 2023-01-10 LAB — URINALYSIS, ROUTINE W REFLEX MICROSCOPIC
Bilirubin Urine: NEGATIVE
Hgb urine dipstick: NEGATIVE
Ketones, ur: NEGATIVE
Leukocytes,Ua: NEGATIVE
Nitrite: NEGATIVE
RBC / HPF: NONE SEEN (ref 0–?)
Specific Gravity, Urine: 1.025 (ref 1.000–1.030)
Total Protein, Urine: NEGATIVE
Urine Glucose: NEGATIVE
Urobilinogen, UA: 0.2 (ref 0.0–1.0)
WBC, UA: NONE SEEN (ref 0–?)
pH: 6.5 (ref 5.0–8.0)

## 2023-01-10 NOTE — Progress Notes (Signed)
The test results show that your current treatment is OK, as the tests are stable.  Please continue the same plan.  There is no other need for change of treatment or further evaluation based on these results, at this time.  thanks 

## 2023-01-11 NOTE — Telephone Encounter (Signed)
I left another message for the UA on nov 21 (the other results were seen nov 20)  No significant problem with the UA.  No further eval or tx needed

## 2023-03-15 ENCOUNTER — Ambulatory Visit (INDEPENDENT_AMBULATORY_CARE_PROVIDER_SITE_OTHER): Payer: Medicare Other

## 2023-03-15 DIAGNOSIS — Z0000 Encounter for general adult medical examination without abnormal findings: Secondary | ICD-10-CM | POA: Diagnosis not present

## 2023-03-15 NOTE — Progress Notes (Signed)
Subjective:   Jared Cantrell is a 73 y.o. male who presents for Medicare Annual/Subsequent preventive examination.  Visit Complete: Virtual I connected with  Jared Cantrell on 03/15/23 by a video and audio enabled telemedicine application and verified that I am speaking with the correct person using two identifiers.  Patient Location: Home  Provider Location: Home Office  I discussed the limitations of evaluation and management by telemedicine. The patient expressed understanding and agreed to proceed.  Vital Signs: Because this visit was a virtual/telehealth visit, some criteria may be missing or patient reported. Any vitals not documented were not able to be obtained and vitals that have been documented are patient reported.    Cardiac Risk Factors include: advanced age (>28men, >65 women);dyslipidemia;hypertension;male gender     Objective:    Today's Vitals   There is no height or weight on file to calculate BMI.     03/15/2023    9:32 AM 07/05/2022    9:51 AM 05/31/2022   10:15 AM 04/16/2022    8:41 AM 11/21/2020   10:47 AM 11/17/2013   11:11 AM 11/17/2013   11:10 AM  Advanced Directives  Does Patient Have a Medical Advance Directive? Yes Yes Yes Yes Yes Yes No  Type of Estate agent of [place];Living will  Living will;Healthcare Power of Attorney Living will;Healthcare Power of Attorney Living will;Healthcare Power of Attorney Living will   Does patient want to make changes to medical advance directive?  No - Patient declined   No - Patient declined No - Patient declined   Copy of Healthcare Power of Attorney in Chart? No - copy requested    No - copy requested    Would patient like information on creating a medical advance directive?      No - patient declined information No - patient declined information    Current Medications (verified) Outpatient Encounter Medications as of 03/15/2023  Medication Sig   amitriptyline (ELAVIL) 50 MG tablet  Take 1 tablet (50 mg total) by mouth at bedtime.   aspirin 81 MG EC tablet Take 81 mg by mouth daily.   atorvastatin (LIPITOR) 40 MG tablet Take 1 tablet (40 mg total) by mouth daily.   losartan (COZAAR) 100 MG tablet Take 1 tablet (100 mg total) by mouth daily.   tamsulosin (FLOMAX) 0.4 MG CAPS capsule Take 1 capsule (0.4 mg total) by mouth daily after supper.   No facility-administered encounter medications on file as of 03/15/2023.    Allergies (verified) Patient has no known allergies.   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 Retta Diones   RADIOACTIVE SEED IMPLANT N/A 07/05/2022   Procedure: RADIOACTIVE SEED IMPLANT/BRACHYTHERAPY IMPLANT;  Surgeon: Marcine Matar, MD;  Location: [facility];  Service: Urology;  Laterality: N/A;  90 MINS   SPACE OAR INSTILLATION N/A 07/05/2022   Procedure: SPACE OAR INSTILLATION;  Surgeon: Marcine Matar, MD;  Location: [facility];  Service: Urology;  Laterality: N/A;   WISDOM TOOTH EXTRACTION     Family  History  Problem Relation Age of Onset   Diabetes Mother    Hypertension Mother    Arthritis Mother        RA   Hypertension Sister    Stroke Sister    Cancer Brother        throat cancer/smoker   Hypertension Brother    Colon cancer Neg Hx    Pancreatic cancer Neg Hx    Rectal cancer Neg Hx    Stomach cancer Neg Hx    Social History   Socioeconomic History   Marital status: Single    Spouse name: Not on file   Number of  children: 2   Years of education: Not on file   Highest education level: Not on file  Occupational History   Occupation: VP Finance - [employer]  Tobacco Use   Smoking status: Never   Smokeless tobacco: Never  Vaping Use   Vaping status: Never Used  Substance and Sexual Activity   Alcohol use: Yes    Alcohol/week: 2.0 standard drinks of alcohol    Types: 2 Glasses of wine per week    Comment: social   Drug use: No   Sexual activity: Not on file  Other Topics Concern   Not on file  Social History Narrative   Not on file   Social Drivers of Health   Financial Resource Strain: Low Risk  (03/15/2023)   Overall Financial Resource Strain (CARDIA)    Difficulty of Paying Living Expenses: Not hard at all  Food Insecurity: No Food Insecurity (03/15/2023)   Hunger Vital Sign    Worried About Running Out of Food in the Last Year: Never true    Ran Out of Food in the Last Year: Never true  Transportation Needs: No Transportation Needs (03/15/2023)   PRAPARE - Administrator, Civil Service (Medical): No    Lack of Transportation (Non-Medical): No  Physical Activity: Insufficiently Active (03/15/2023)   Exercise Vital Sign    Days of Exercise per Week: 1 day    Minutes of Exercise per Session: 40 min  Stress: No Stress Concern Present (03/15/2023)   [employer] of Occupational Health - Occupational Stress Questionnaire    Feeling of Stress : Not at all  Social Connections: Moderately Integrated (03/15/2023)   Social Connection and Isolation Panel [NHANES]    Frequency of Communication with Friends and Family: Three times a week    Frequency of Social Gatherings with Friends and Family: Once a week    Attends Religious Services: More than 4 times per year    Active Member of [employer] or Organizations: Yes    Attends Engineer, structural: More than 4 times per year    Marital Status: Divorced    Tobacco Counseling Counseling given: Not Answered   Clinical  Intake:  Pre-visit preparation completed: Yes  Pain : No/denies pain     Nutritional Risks: Nausea/ vomitting/ diarrhea Diabetes: No  How often do you need to have someone help you when you read instructions, pamphlets, or other written materials from your doctor or pharmacy?: 1 - Never  Interpreter Needed?: No  Information entered by :: NAllen LPN   Activities of Daily Living    03/15/2023    9:27 AM 07/05/2022    9:56 AM  In your present state of health, do you have any difficulty performing the following activities:  Hearing? 0 0  Vision? 0 0  Difficulty concentrating or making decisions? 0 0  Walking  or climbing stairs? 0 0  Dressing or bathing? 0 0  Doing errands, shopping? 0   Preparing Food and eating ? N   Using the Toilet? N   In the past six months, have you accidently leaked urine? N   Do you have problems with loss of bowel control? N   Managing your Medications? N   Managing your Finances? N   Housekeeping or managing your Housekeeping? N     Patient Care Team: Jared Levins, MD as PCP - General Jared Cantrell as Consulting Physician (Optometry) Jared Cushing, RN as Oncology Nurse Navigator  Indicate any recent Medical Services you may have received from other than [facility] providers in the past year (date may be approximate).     Assessment:   This is a routine wellness examination for Ronte.  Hearing/Vision screen Hearing Screening - Comments:: Denies hearing issues Vision Screening - Comments:: Regular eye exams, [facility]   Goals Addressed             This Visit's Progress    Patient Stated       03/15/2023, continue fast until end of the month       Depression Screen    03/15/2023    9:34 AM 01/09/2023    9:43 AM 04/16/2022    8:50 AM 01/04/2022    9:56 AM 01/04/2022    9:38 AM 11/21/2020   10:50 AM 07/20/2020    8:14 AM  PHQ 2/9 Scores  PHQ - 2 Score 0 0 0 0 0 0 0  PHQ- 9 Score     0      Fall Risk    03/15/2023     9:33 AM 01/09/2023    9:43 AM 01/04/2022    9:55 AM 01/04/2022    9:38 AM 11/21/2020   10:49 AM  Fall Risk   Falls in the past year? 0 0 0 0 0  Number falls in past yr: 0 0 0 0 0  Injury with Fall? 0 0 0 0 0  Risk for fall due to : Medication side effect No Fall Risks  No Fall Risks No Fall Risks  Follow up Falls prevention discussed;Falls evaluation completed Falls evaluation completed  Falls evaluation completed Falls evaluation completed    MEDICARE RISK AT HOME: Medicare Risk at Home Any stairs in or around the home?: Yes If so, are there any without handrails?: No Home free of loose throw rugs in walkways, pet beds, electrical cords, etc?: Yes Adequate lighting in your home to reduce risk of falls?: Yes Life alert?: No Use of a cane, walker or w/c?: No Grab bars in the bathroom?: No Shower chair or bench in shower?: Yes Elevated toilet seat or a handicapped toilet?: No  TIMED UP AND GO:  Was the test performed?  No    Cognitive Function:        03/15/2023    9:34 AM  6CIT Screen  What Year? 0 points  What month? 0 points  What time? 0 points  Count back from 20 0 points  Months in reverse 0 points  Repeat phrase 0 points  Total Score 0 points    Immunizations Immunization History  Administered Date(s) Administered   Fluad Quad(high Dose 65+) 12/19/2018, 01/04/2022, 12/09/2022   Influenza, High Dose Seasonal PF 01/09/2018   Influenza,inj,Quad PF,6+ Mos 01/08/2017   Influenza-Unspecified 12/14/2019   PFIZER(Purple Top)SARS-COV-2 Vaccination 04/05/2019, 04/28/2019, 12/14/2019, 07/06/2020   Pfizer(Comirnaty)Fall Seasonal Vaccine 12 years and older  12/10/2022   Pneumococcal Conjugate-13 01/23/2018   Pneumococcal Polysaccharide-23 03/25/2019   Td 05/12/2008   Tdap 01/13/2019   Zoster Recombinant(Shingrix) 07/21/2019, 09/21/2019    TDAP status: Up to date  Flu Vaccine status: Up to date  Pneumococcal vaccine status: Up to date  Covid-19 vaccine status:  Completed vaccines  Qualifies for Shingles Vaccine? Yes   Zostavax completed Yes   Shingrix Completed?: Yes  Screening Tests Health Maintenance  Topic Date Due   COVID-19 Vaccine (6 - 2024-25 season) 02/04/2023   Colonoscopy  12/02/2023   Medicare Annual Wellness (AWV)  03/14/2024   DTaP/Tdap/Td (3 - Td or Tdap) 01/12/2029   Pneumonia Vaccine 74+ Years old  Completed   INFLUENZA VACCINE  Completed   Hepatitis C Screening  Completed   Zoster Vaccines- Shingrix  Completed   HPV VACCINES  Aged Out    Health Maintenance  Health Maintenance Due  Topic Date Due   COVID-19 Vaccine (6 - 2024-25 season) 02/04/2023    Colorectal cancer screening: Type of screening: Colonoscopy. Completed 12/01/2013. Repeat every 10 years  Lung Cancer Screening: (Low Dose CT Chest recommended if Age 36-80 years, 20 pack-year currently smoking OR have quit w/in 15years.) does not qualify.   Lung Cancer Screening Referral: no  Additional Screening:  Hepatitis C Screening: does qualify; Completed 12/31/2014  Vision Screening: Recommended annual ophthalmology exams for early detection of glaucoma and other disorders of the eye. Is the patient up to date with their annual eye exam?  Yes  Who is the provider or what is the name of the office in which the patient attends annual eye exams? [facility] If pt is not established with a provider, would they like to be referred to a provider to establish care? No .   Dental Screening: Recommended annual dental exams for proper oral hygiene  Diabetic Foot Exam: n/a  Community Resource Referral / Chronic Care Management: CRR required this visit?  No   CCM required this visit?  No     Plan:     I have personally reviewed and noted the following in the patient's chart:   Medical and social history Use of alcohol, tobacco or illicit drugs  Current medications and supplements including opioid prescriptions. Patient is not currently taking opioid  prescriptions. Functional ability and status Nutritional status Physical activity Advanced directives List of other physicians Hospitalizations, surgeries, and ER visits in previous 12 months Vitals Screenings to include cognitive, depression, and falls Referrals and appointments  In addition, I have reviewed and discussed with patient certain preventive protocols, quality metrics, and best practice recommendations. A written personalized care plan for preventive services as well as general preventive health recommendations were provided to patient.     Barb Merino, LPN   05/28/8117   After Visit Summary: (MyChart) Due to this being a telephonic visit, the after visit summary with patients personalized plan was offered to patient via MyChart   Nurse Notes: none

## 2023-03-15 NOTE — Patient Instructions (Signed)
Jared Cantrell , Thank you for taking time to come for your Medicare Wellness Visit. I appreciate your ongoing commitment to your health goals. Please review the following plan we discussed and let me know if I can assist you in the future.   Referrals/Orders/Follow-Ups/Clinician Recommendations: none  This is a list of the screening recommended for you and due dates:  Health Maintenance  Topic Date Due   COVID-19 Vaccine (6 - 2024-25 season) 02/04/2023   Colon Cancer Screening  12/02/2023   Medicare Annual Wellness Visit  03/14/2024   DTaP/Tdap/Td vaccine (3 - Td or Tdap) 01/12/2029   Pneumonia Vaccine  Completed   Flu Shot  Completed   Hepatitis C Screening  Completed   Zoster (Shingles) Vaccine  Completed   HPV Vaccine  Aged Out    Advanced directives: (Copy Requested) Please bring a copy of your health care power of attorney and living will to the office to be added to your chart at your convenience.  Next Medicare Annual Wellness Visit scheduled for next year: Yes  insert Preventive Care attachment Insert FALL PREVENTION attachment if needed

## 2023-12-31 ENCOUNTER — Ambulatory Visit: Admitting: Internal Medicine

## 2024-01-08 ENCOUNTER — Ambulatory Visit: Admitting: Internal Medicine

## 2024-01-22 ENCOUNTER — Telehealth: Payer: Self-pay | Admitting: Internal Medicine

## 2024-01-22 NOTE — Telephone Encounter (Unsigned)
 Copied from CRM [id number]. Topic: Clinical - Medication Refill >> Jan 22, 2024  1:15 PM Tanazia G wrote: Medication:  losartan  (COZAAR ) 100 MG tablet  atorvastatin  (LIPITOR) 40 MG tablet   Has the patient contacted their pharmacy? Yes (Agent: If no, request that the patient contact the pharmacy for the refill. If patient does not wish to contact the pharmacy document the reason why and proceed with request.) (Agent: If yes, when and what did the pharmacy advise?)  This is the patient's preferred pharmacy:   [facility] - [place], [place] - [address] [place] [address] Phone: [phone] Fax: [phone]  Is this the correct pharmacy for this prescription? Yes If no, delete pharmacy and type the correct one.   Has the prescription been filled recently? Yes  Is the patient out of the medication? Yes  Has the patient been seen for an appointment in the last year OR does the patient have an upcoming appointment? Yes  Can we respond through MyChart? Yes  Agent: Please be advised that Rx refills may take up to 3 business days. We ask that you follow-up with your pharmacy.

## 2024-01-23 MED ORDER — ATORVASTATIN CALCIUM 40 MG PO TABS: 40.0000 mg | ORAL_TABLET | Freq: Every day | ORAL | 3 refills | Status: DC

## 2024-01-23 MED ORDER — LOSARTAN POTASSIUM 100 MG PO TABS: 100.0000 mg | ORAL_TABLET | Freq: Every day | ORAL | 3 refills | Status: DC

## 2024-01-27 ENCOUNTER — Ambulatory Visit: Admitting: Internal Medicine

## 2024-01-27 ENCOUNTER — Encounter: Payer: Self-pay | Admitting: Internal Medicine

## 2024-01-27 ENCOUNTER — Ambulatory Visit: Payer: Self-pay | Admitting: Internal Medicine

## 2024-01-27 VITALS — BP 120/80 | HR 75 | Temp 97.9°F | Ht 70.0 in | Wt 191.0 lb

## 2024-01-27 DIAGNOSIS — F411 Generalized anxiety disorder: Secondary | ICD-10-CM

## 2024-01-27 DIAGNOSIS — C61 Malignant neoplasm of prostate: Secondary | ICD-10-CM

## 2024-01-27 DIAGNOSIS — Z1211 Encounter for screening for malignant neoplasm of colon: Secondary | ICD-10-CM

## 2024-01-27 DIAGNOSIS — R739 Hyperglycemia, unspecified: Secondary | ICD-10-CM

## 2024-01-27 DIAGNOSIS — E559 Vitamin D deficiency, unspecified: Secondary | ICD-10-CM

## 2024-01-27 DIAGNOSIS — I10 Essential (primary) hypertension: Secondary | ICD-10-CM

## 2024-01-27 DIAGNOSIS — E7800 Pure hypercholesterolemia, unspecified: Secondary | ICD-10-CM

## 2024-01-27 LAB — BASIC METABOLIC PANEL WITH GFR
BUN: 11 mg/dL (ref 6–23)
CO2: 29 meq/L (ref 19–32)
Calcium: 10.8 mg/dL — ABNORMAL HIGH (ref 8.4–10.5)
Chloride: 105 meq/L (ref 96–112)
Creatinine, Ser: 0.79 mg/dL (ref 0.40–1.50)
GFR: 89.66 mL/min (ref >60.00)
Glucose, Bld: 102 mg/dL — ABNORMAL HIGH (ref 70–99)
Potassium: 4.3 meq/L (ref 3.5–5.1)
Sodium: 141 meq/L (ref 135–145)

## 2024-01-27 LAB — URINALYSIS, ROUTINE W REFLEX MICROSCOPIC
Bilirubin Urine: NEGATIVE
Hgb urine dipstick: NEGATIVE
Ketones, ur: NEGATIVE
Leukocytes,Ua: NEGATIVE
Nitrite: NEGATIVE
RBC / HPF: NONE SEEN (ref 0–?)
Specific Gravity, Urine: 1.015 (ref 1.000–1.030)
Total Protein, Urine: NEGATIVE
Urine Glucose: NEGATIVE
Urobilinogen, UA: 0.2 (ref 0.0–1.0)
WBC, UA: NONE SEEN (ref 0–?)
pH: 7.5 (ref 5.0–8.0)

## 2024-01-27 LAB — HEPATIC FUNCTION PANEL
ALT: 27 U/L (ref 0–53)
AST: 20 U/L (ref 0–37)
Albumin: 4.3 g/dL (ref 3.5–5.2)
Alkaline Phosphatase: 97 U/L (ref 39–117)
Bilirubin, Direct: 0 mg/dL (ref 0.0–0.3)
Total Bilirubin: 0.4 mg/dL (ref 0.2–1.2)
Total Protein: 7.6 g/dL (ref 6.0–8.3)

## 2024-01-27 LAB — CBC WITH DIFFERENTIAL/PLATELET
Basophils Absolute: 0 K/uL (ref 0.0–0.1)
Basophils Relative: 0.6 % (ref 0.0–3.0)
Eosinophils Absolute: 0.1 K/uL (ref 0.0–0.7)
Eosinophils Relative: 4.9 % (ref 0.0–5.0)
HCT: 37.6 % — ABNORMAL LOW (ref 39.0–52.0)
Hemoglobin: 12.1 g/dL — ABNORMAL LOW (ref 13.0–17.0)
Lymphocytes Relative: 22 % (ref 12.0–46.0)
Lymphs Abs: 0.7 K/uL (ref 0.7–4.0)
MCHC: 32.2 g/dL (ref 30.0–36.0)
MCV: 80.3 fl (ref 78.0–100.0)
Monocytes Absolute: 0.4 K/uL (ref 0.1–1.0)
Monocytes Relative: 13.5 % — ABNORMAL HIGH (ref 3.0–12.0)
Neutro Abs: 1.8 K/uL (ref 1.4–7.7)
Neutrophils Relative %: 59 % (ref 43.0–77.0)
Platelets: 206 K/uL (ref 150.0–400.0)
RBC: 4.68 Mil/uL (ref 4.22–5.81)
RDW: 16 % — ABNORMAL HIGH (ref 11.5–15.5)
WBC: 3 K/uL — ABNORMAL LOW (ref 4.0–10.5)

## 2024-01-27 LAB — LIPID PANEL
Cholesterol: 199 mg/dL (ref 0–200)
HDL: 68.1 mg/dL (ref >39.00)
LDL Cholesterol: 115 mg/dL — ABNORMAL HIGH (ref 0–99)
NonHDL: 131.24
Total CHOL/HDL Ratio: 3
Triglycerides: 81 mg/dL (ref 0.0–149.0)
VLDL: 16.2 mg/dL (ref 0.0–40.0)

## 2024-01-27 LAB — HEMOGLOBIN A1C: Hgb A1c MFr Bld: 6.4 % (ref 4.6–6.5)

## 2024-01-27 LAB — VITAMIN D 25 HYDROXY (VIT D DEFICIENCY, FRACTURES): VITD: 37.25 ng/mL (ref 30.00–100.00)

## 2024-01-27 LAB — TSH: TSH: 1.15 u[IU]/mL (ref 0.35–5.50)

## 2024-01-27 MED ORDER — ATORVASTATIN CALCIUM 40 MG PO TABS: 40.0000 mg | ORAL_TABLET | Freq: Every day | ORAL | 3 refills | Status: AC

## 2024-01-27 MED ORDER — LOSARTAN POTASSIUM 100 MG PO TABS: 100.0000 mg | ORAL_TABLET | Freq: Every day | ORAL | 3 refills | Status: AC

## 2024-01-27 NOTE — Progress Notes (Signed)
 Patient ID: Jared Cantrell, male   DOB: Sep 05, 1950, 72 y.o.   MRN: [id number]         Chief Complaint:: yearly exam       HPI:  Jared Cantrell is a 73 y.o. male here overall doing ok, Denies urinary symptoms such as dysuria, frequency, urgency, flank pain, hematuria or n/v, fever, chills. Pt is sp seed implant and antiandrogen tx per urology.  Has occas hot flashes but tolerable.  Pt denies chest pain, increased sob or doe, wheezing, orthopnea, PND, increased LE swelling, palpitations, dizziness or syncope.   Pt denies polydipsia, polyuria, or new focal neuro s/s.    Pt denies fever, wt loss, night sweats, loss of appetite, or other constitutional symptoms  Due for colonoscopy  Denies worsening depressive symptoms, suicidal ideation, or panic Wt Readings from Last 3 Encounters:  01/27/24 191 lb (86.6 kg)  01/09/23 181 lb (82.1 kg)  07/05/22 179 lb 9.6 oz (81.5 kg)   BP Readings from Last 3 Encounters:  01/27/24 120/80  01/09/23 122/78  07/05/22 126/85   Immunization History  Administered Date(s) Administered   Fluad Quad(high Dose 65+) 12/19/2018, 01/04/2022, 12/09/2022   INFLUENZA, HIGH DOSE SEASONAL PF 01/09/2018   Influenza,inj,Quad PF,6+ Mos 01/08/2017   Influenza-Unspecified 12/14/2019, 12/04/2023   PFIZER(Purple Top)SARS-COV-2 Vaccination 04/05/2019, 04/28/2019, 12/14/2019, 07/06/2020   Pfizer(Comirnaty)Fall Seasonal Vaccine 12 years and older 12/10/2022   Pneumococcal Conjugate-13 01/23/2018   Pneumococcal Polysaccharide-23 03/25/2019   Td 05/12/2008   Tdap 01/13/2019   Zoster Recombinant(Shingrix) 07/21/2019, 09/21/2019   Health Maintenance Due  Topic Date Due   Colonoscopy  12/02/2023   Medicare Annual Wellness (AWV)  03/14/2024      Past Medical History:  Diagnosis Date   ANXIETY 05/12/2008   BENIGN PROSTATIC HYPERTROPHY 05/12/2008   Cancer (HCC)    ERECTILE DYSFUNCTION, 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 Matilda   RADIOACTIVE SEED IMPLANT N/A 07/05/2022   Procedure: RADIOACTIVE SEED IMPLANT/BRACHYTHERAPY IMPLANT;  Surgeon: Matilda Senior, MD;  Location: [facility];  Service: Urology;  Laterality: N/A;  90 MINS   SPACE OAR INSTILLATION N/A 07/05/2022   Procedure: SPACE OAR INSTILLATION;  Surgeon: Matilda Senior, MD;  Location: [facility];  Service: Urology;  Laterality: N/A;   WISDOM TOOTH EXTRACTION      reports that he has never smoked. He has never used smokeless tobacco. He reports current alcohol use of about 2.0 standard drinks of alcohol per week. He reports that he does not use drugs. family history includes Arthritis in his mother; Cancer in his brother; Diabetes in his mother; Hypertension in his brother, mother, and sister; Stroke in his sister. No Known Allergies Current Outpatient Medications on File Prior to Visit  Medication Sig Dispense Refill   amitriptyline  (ELAVIL ) 50 MG tablet Take 1 tablet (50 mg total) by mouth at bedtime. 90 tablet 3   aspirin 81 MG EC tablet Take 81 mg by mouth daily.     GEMTESA 75 MG TABS Take 1 tablet by mouth daily.     No current facility-administered medications on file prior to visit.        ROS:  All others reviewed and  negative.  Objective        PE:  BP 120/80 (BP Location: Left Arm, Patient Position: Sitting, Cuff Size: Normal)   Pulse 75   Temp 97.9 F (36.6 C) (Oral)   Ht 5' 10 (1.778 m)   Wt 191 lb (86.6 kg)   SpO2 98%   BMI 27.41 kg/m                 Constitutional: Pt appears in NAD               HENT: Head: NCAT.                Right Ear: External ear normal.                 Left Ear: External ear  normal.                Eyes: . Pupils are equal, round, and reactive to light. Conjunctivae and EOM are normal               Nose: without d/c or deformity               Neck: Neck supple. Gross normal ROM               Cardiovascular: Normal rate and regular rhythm.                 Pulmonary/Chest: Effort normal and breath sounds without rales or wheezing.                Abd:  Soft, NT, ND, + BS, no organomegaly               Neurological: Pt is alert. At baseline orientation, motor grossly intact               Skin: Skin is warm. No rashes, no other new lesions, LE edema - none               Psychiatric: Pt behavior is normal without agitation   Micro: none  Cardiac tracings I have personally interpreted today:  none  Pertinent Radiological findings (summarize): none   Lab Results  Component Value Date   WBC 3.0 (L) 01/27/2024   HGB 12.1 (L) 01/27/2024   HCT 37.6 (L) 01/27/2024   PLT 206.0 01/27/2024   GLUCOSE 102 (H) 01/27/2024   CHOL 199 01/27/2024   TRIG 81.0 01/27/2024   HDL 68.10 01/27/2024   LDLCALC 115 (H) 01/27/2024   ALT 27 01/27/2024   AST 20 01/27/2024   NA 141 01/27/2024   K 4.3 01/27/2024   CL 105 01/27/2024   CREATININE 0.79 01/27/2024   BUN 11 01/27/2024   CO2 29 01/27/2024   TSH 1.15 01/27/2024   PSA 0.00 (L) 01/09/2023   HGBA1C 6.4 01/27/2024   Assessment/Plan:  Jared Cantrell is a 73 y.o. Black or African American [2] male with  has a past medical history of ANXIETY (05/12/2008), BENIGN PROSTATIC HYPERTROPHY (05/12/2008), Cancer (HCC), ERECTILE DYSFUNCTION, ORGANIC (04/04/2010), HYPERLIPIDEMIA (05/12/2008), HYPERTENSION (04/04/2010), and HYPOGONADISM, MALE (05/12/2008).  Vitamin D  deficiency Last vitamin D  Lab Results  Component Value Date   VD25OH 37.25 01/27/2024   Low, to start oral replacement   Hyperlipidemia Lab Results  Component Value Date   LDLCALC 115 (H) 01/27/2024   uncontrolled, pt to continue current statin lipitor 40 mg  every day and lower chol diet, declines other change for now   Essential hypertension BP Readings from  Last 3 Encounters:  01/27/24 120/80  01/09/23 122/78  07/05/22 126/85   Stable, pt to continue medical treatment losartan  100 mg qd   Anxiety state Stable, continue current med tx  Malignant neoplasm of prostate (HCC) Overall stable, cont anti estrogen tx, f/u urology as planned  Followup: Return in about 6 months (around 07/27/2024).  Lynwood Rush, MD 01/27/2024 9:17 PM [facility]

## 2024-01-27 NOTE — Assessment & Plan Note (Signed)
Stable, continue current med tx ?

## 2024-01-27 NOTE — Assessment & Plan Note (Signed)
 Overall stable, cont anti estrogen tx, f/u urology as planned

## 2024-01-27 NOTE — Patient Instructions (Signed)

## 2024-01-27 NOTE — Assessment & Plan Note (Signed)
 Last vitamin D  Lab Results  Component Value Date   VD25OH 37.25 01/27/2024   Low, to start oral replacement

## 2024-01-27 NOTE — Assessment & Plan Note (Signed)
 BP Readings from Last 3 Encounters:  01/27/24 120/80  01/09/23 122/78  07/05/22 126/85   Stable, pt to continue medical treatment losartan  100 mg qd

## 2024-01-27 NOTE — Assessment & Plan Note (Signed)
 Lab Results  Component Value Date   LDLCALC 115 (H) 01/27/2024   uncontrolled, pt to continue current statin lipitor 40 mg every day and lower chol diet, declines other change for now

## 2024-01-27 NOTE — Progress Notes (Signed)
 The test results show that your current treatment is OK, as the tests are stable, except the LDL cholesterol is mildly high.  Please follow a lower cholesterol diet and continue the lipitor 40 mg.  Otherwise,  Please continue the same plan.  There is no other need for change of treatment or further evaluation based on these results, at this time.  thanks

## 2024-03-06 ENCOUNTER — Encounter: Payer: Self-pay | Admitting: Internal Medicine

## 2024-03-16 ENCOUNTER — Ambulatory Visit: Payer: Medicare Other

## 2024-03-20 ENCOUNTER — Ambulatory Visit

## 2024-03-20 VITALS — Ht 70.0 in | Wt 191.0 lb

## 2024-03-20 DIAGNOSIS — Z0000 Encounter for general adult medical examination without abnormal findings: Secondary | ICD-10-CM

## 2024-03-20 NOTE — Progress Notes (Signed)
 "  Chief Complaint  Patient presents with   Medicare Wellness     Subjective:   Jared Cantrell is a 74 y.o. male who presents for a Medicare Annual Wellness Visit.  Visit info / Clinical Intake: Medicare Wellness Visit Type:: Subsequent Annual Wellness Visit Persons participating in visit and providing information:: patient Medicare Wellness Visit Mode:: Video Since this visit was completed virtually, some vitals may be partially provided or unavailable. Missing vitals are due to the limitations of the virtual format.: Unable to obtain vitals - no equipment If Telephone or Video please confirm:: I connected with patient using audio/video enable telemedicine. I verified patient identity with two identifiers, discussed telehealth limitations, and patient agreed to proceed. Patient Location:: Home Provider Location:: Office Interpreter Needed?: No Pre-visit prep was completed: yes AWV questionnaire completed by patient prior to visit?: yes Living arrangements:: (Patient-Rptd) lives with spouse/significant other Patient's Overall Health Status Rating: (Patient-Rptd) good Typical amount of pain: (Patient-Rptd) none Does pain affect daily life?: (Patient-Rptd) no Are you currently prescribed opioids?: no  Dietary Habits and Nutritional Risks How many meals a day?: (Patient-Rptd) 3 Eats fruit and vegetables daily?: (Patient-Rptd) yes Most meals are obtained by: (Patient-Rptd) preparing own meals In the last 2 weeks, have you had any of the following?: none Diabetic:: no  Functional Status Activities of Daily Living (to include ambulation/medication): (Patient-Rptd) Independent Ambulation: Independent with device- listed below Home Assistive Devices/Equipment: Eyeglasses Medication Administration: (Patient-Rptd) Independent Home Management (perform basic housework or laundry): (Patient-Rptd) Independent Manage your own finances?: (Patient-Rptd) yes Primary transportation is:  (Patient-Rptd) driving Concerns about vision?: no *vision screening is required for WTM* Concerns about hearing?: no  Fall Screening Falls in the past year?: (Patient-Rptd) 0 Number of falls in past year: 0 Was there an injury with Fall?: 0 Fall Risk Category Calculator: 0 Patient Fall Risk Level: Low Fall Risk  Fall Risk Patient at Risk for Falls Due to: No Fall Risks Fall risk Follow up: Falls evaluation completed; Falls prevention discussed  Home and Transportation Safety: All rugs have non-skid backing?: (Patient-Rptd) N/A, no rugs All stairs or steps have railings?: (Patient-Rptd) yes Grab bars in the bathtub or shower?: (!) (Patient-Rptd) no Have non-skid surface in bathtub or shower?: (!) (Patient-Rptd) no Good home lighting?: (Patient-Rptd) yes Regular seat belt use?: (Patient-Rptd) yes Hospital stays in the last year:: (Patient-Rptd) no  Cognitive Assessment Difficulty concentrating, remembering, or making decisions? : (Patient-Rptd) no Will 6CIT or Mini Cog be Completed: no 6CIT or Mini Cog Declined: patient alert, oriented, able to answer questions appropriately and recall recent events  Advance Directives (For Healthcare) Does Patient Have a Medical Advance Directive?: Yes Type of Advance Directive: Healthcare Power of [place]; Living will Copy of Healthcare Power of Attorney in Chart?: No - copy requested Copy of Living Will in Chart?: No - copy requested  Reviewed/Updated  Reviewed/Updated: Reviewed All (Medical, Surgical, Family, Medications, Allergies, Care Teams, Patient Goals)    Allergies (verified) Patient has no known allergies.   Current Medications (verified) Outpatient Encounter Medications as of 03/20/2024  Medication Sig   amitriptyline  (ELAVIL ) 50 MG tablet Take 1 tablet (50 mg total) by mouth at bedtime.   aspirin 81 MG EC tablet Take 81 mg by mouth daily.   atorvastatin  (LIPITOR) 40 MG tablet Take 1 tablet (40 mg total) by mouth daily.    losartan  (COZAAR ) 100 MG tablet Take 1 tablet (100 mg total) by mouth daily.   GEMTESA 75 MG TABS Take 1 tablet by mouth daily.  No facility-administered encounter medications on file as of 03/20/2024.    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 Matilda   RADIOACTIVE SEED IMPLANT N/A 07/05/2022   Procedure: RADIOACTIVE SEED IMPLANT/BRACHYTHERAPY IMPLANT;  Surgeon: Matilda Senior, MD;  Location: [facility];  Service: Urology;  Laterality: N/A;  90 MINS   SPACE OAR INSTILLATION N/A 07/05/2022   Procedure: SPACE OAR INSTILLATION;  Surgeon: Matilda Senior, MD;  Location: [facility];  Service: Urology;  Laterality: N/A;   WISDOM TOOTH EXTRACTION     Family History  Problem Relation Age of Onset   Diabetes Mother    Hypertension Mother    Arthritis Mother        RA   Hypertension Sister    Stroke Sister    Cancer Brother        throat cancer/smoker   Hypertension Brother    Colon cancer Neg Hx    Pancreatic cancer Neg Hx    Rectal cancer Neg Hx    Stomach cancer Neg Hx    Social History   Occupational History   Occupation: VP Finance - [employer]   Occupation: RETIRED  Tobacco Use   Smoking status: Never   Smokeless tobacco: Never  Vaping Use   Vaping status: Never Used  Substance and Sexual Activity   Alcohol use: Yes    Alcohol/week: 2.0 standard drinks of alcohol    Types: 2 Glasses of wine per week    Comment: social   Drug use: No   Sexual activity: Not on  file   Tobacco Counseling Counseling given: Not Answered  SDOH Screenings   Food Insecurity: No Food Insecurity (03/15/2024)  Housing: Low Risk (03/15/2024)  Transportation Needs: No Transportation Needs (03/15/2024)  Utilities: Not At Risk (03/20/2024)  Alcohol Screen: Low Risk (03/15/2023)  Depression (PHQ2-9): Low Risk (03/20/2024)  Financial Resource Strain: Low Risk (03/15/2024)  Physical Activity: Insufficiently Active (03/15/2024)  Social Connections: Socially Integrated (03/15/2024)  Stress: No Stress Concern Present (03/15/2024)  Tobacco Use: Low Risk (03/20/2024)  Health Literacy: Adequate Health Literacy (03/20/2024)   See flowsheets for full screening details  Depression Screen PHQ 2 & 9 Depression Scale- Over the past 2 weeks, how often have you been bothered by any of the following problems? Little interest or pleasure in doing things: 0 Feeling down, depressed, or hopeless (PHQ Adolescent also includes...irritable): 0 PHQ-2 Total Score: 0 Trouble falling or staying asleep, or sleeping too much: 0 Feeling tired or having little energy: 0 Poor appetite or overeating (PHQ Adolescent also includes...weight loss): 0 Feeling bad about yourself - or that you are a failure or have let yourself or your family down: 0 Trouble concentrating on things, such as reading the newspaper or watching television (PHQ Adolescent also includes...like school work): 0 Moving or speaking so slowly that other people could have noticed. Or the opposite - being so fidgety or restless that you have been moving around a lot more than usual: 0 Thoughts that you would be better off dead, or of  hurting yourself in some way: 0 PHQ-9 Total Score: 0 If you checked off any problems, how difficult have these problems made it for you to do your work, take care of things at home, or get along with other people?: Not difficult at all  Depression Treatment Depression Interventions/Treatment : EYV7-0 Score <4  Follow-up Not Indicated     Goals Addressed   None          Objective:    Today's Vitals   03/20/24 1507  Weight: 191 lb (86.6 kg)  Height: 5' 10 (1.778 m)   Body mass index is 27.41 kg/m.  Hearing/Vision screen Hearing Screening - Comments:: Denies hearing difficulties   Vision Screening - Comments:: Wears eyeglasses/UTD Immunizations and Health Maintenance Health Maintenance  Topic Date Due   COVID-19 Vaccine (6 - 2025-26 season) 10/21/2023   Colonoscopy  12/02/2023   Medicare Annual Wellness (AWV)  03/20/2025   DTaP/Tdap/Td (3 - Td or Tdap) 01/12/2029   Pneumococcal Vaccine: 50+ Years  Completed   Influenza Vaccine  Completed   Hepatitis C Screening  Completed   Zoster Vaccines- Shingrix  Completed   Meningococcal B Vaccine  Aged Out        Assessment/Plan:  This is a routine wellness examination for Jared Cantrell.  Patient Care Team: Norleen Lynwood ORN, MD as PCP - General Elma Zachary RAMAN as Consulting Physician (Optometry) Vertell Pont, RN as Oncology Nurse Navigator  I have personally reviewed and noted the following in the patients chart:   Medical and social history Use of alcohol, tobacco or illicit drugs  Current medications and supplements including opioid prescriptions. Functional ability and status Nutritional status Physical activity Advanced directives List of other physicians Hospitalizations, surgeries, and ER visits in previous 12 months Vitals Screenings to include cognitive, depression, and falls Referrals and appointments  No orders of the defined types were placed in this encounter.  In addition, I have reviewed and discussed with patient certain preventive protocols, quality metrics, and best practice recommendations. A written personalized care plan for preventive services as well as general preventive health recommendations were provided to patient.   Hildegard Hlavac L Satomi Buda, CMA   03/20/2024   Return in 1 year (on 03/20/2025).  After Visit  Summary: (MyChart) Due to this being a telephonic visit, the after visit summary with patients personalized plan was offered to patient via MyChart   Nurse Notes: No voiced or noted concerns at this time. "

## 2024-03-31 ENCOUNTER — Encounter

## 2024-04-16 ENCOUNTER — Encounter: Admitting: Internal Medicine
